# Patient Record
Sex: Female | Born: 1960
Health system: Southern US, Community
[De-identification: ages and names within clinical notes are randomized; demographics above are authoritative.]

## PROBLEM LIST (undated history)

## (undated) DIAGNOSIS — D649 Anemia, unspecified: Secondary | ICD-10-CM

## (undated) DIAGNOSIS — K219 Gastro-esophageal reflux disease without esophagitis: Secondary | ICD-10-CM

## (undated) DIAGNOSIS — E039 Hypothyroidism, unspecified: Secondary | ICD-10-CM

## (undated) DIAGNOSIS — I10 Essential (primary) hypertension: Secondary | ICD-10-CM

## (undated) DIAGNOSIS — C801 Malignant (primary) neoplasm, unspecified: Secondary | ICD-10-CM

## (undated) DIAGNOSIS — E079 Disorder of thyroid, unspecified: Secondary | ICD-10-CM

## (undated) HISTORY — PX: TUBAL LIGATION: SHX77

## (undated) HISTORY — PX: FUNCTIONAL ENDOSCOPIC SINUS SURGERY: SUR616

## (undated) HISTORY — PX: NOVASURE ABLATION: SHX5394

## (undated) HISTORY — DX: Disorder of thyroid, unspecified: E07.9

## (undated) HISTORY — PX: COLONOSCOPY: SHX174

---

## 2005-08-03 ENCOUNTER — Emergency Department: Payer: Self-pay | Admitting: Emergency Medicine

## 2006-07-17 ENCOUNTER — Ambulatory Visit: Payer: Self-pay | Admitting: Obstetrics and Gynecology

## 2007-07-20 ENCOUNTER — Ambulatory Visit: Payer: Self-pay | Admitting: Obstetrics and Gynecology

## 2007-10-19 ENCOUNTER — Ambulatory Visit: Payer: Self-pay | Admitting: Obstetrics and Gynecology

## 2007-10-19 ENCOUNTER — Other Ambulatory Visit: Payer: Self-pay

## 2007-10-22 ENCOUNTER — Ambulatory Visit: Payer: Self-pay | Admitting: Obstetrics and Gynecology

## 2008-07-21 ENCOUNTER — Ambulatory Visit: Payer: Self-pay | Admitting: Obstetrics and Gynecology

## 2008-12-19 ENCOUNTER — Ambulatory Visit: Payer: Self-pay | Admitting: Family Medicine

## 2009-07-25 ENCOUNTER — Ambulatory Visit: Payer: Self-pay

## 2010-09-11 ENCOUNTER — Ambulatory Visit: Payer: Self-pay

## 2011-09-19 ENCOUNTER — Ambulatory Visit: Payer: Self-pay

## 2012-09-22 ENCOUNTER — Ambulatory Visit: Payer: Self-pay

## 2013-10-04 ENCOUNTER — Ambulatory Visit: Payer: Self-pay

## 2014-09-08 DIAGNOSIS — E039 Hypothyroidism, unspecified: Secondary | ICD-10-CM | POA: Insufficient documentation

## 2017-01-19 DIAGNOSIS — M25561 Pain in right knee: Secondary | ICD-10-CM | POA: Diagnosis not present

## 2017-01-19 DIAGNOSIS — M2391 Unspecified internal derangement of right knee: Secondary | ICD-10-CM | POA: Diagnosis not present

## 2017-01-20 ENCOUNTER — Other Ambulatory Visit: Payer: Self-pay | Admitting: Physician Assistant

## 2017-01-20 DIAGNOSIS — M2391 Unspecified internal derangement of right knee: Secondary | ICD-10-CM

## 2017-01-26 ENCOUNTER — Ambulatory Visit (HOSPITAL_COMMUNITY)
Admission: RE | Admit: 2017-01-26 | Discharge: 2017-01-26 | Disposition: A | Discharge status: Home / Self Care | Payer: 59 | Source: Ambulatory Visit | Attending: Physician Assistant | Admitting: Physician Assistant

## 2017-01-26 DIAGNOSIS — M25561 Pain in right knee: Secondary | ICD-10-CM | POA: Diagnosis not present

## 2017-01-26 DIAGNOSIS — M2391 Unspecified internal derangement of right knee: Secondary | ICD-10-CM | POA: Diagnosis not present

## 2017-01-26 DIAGNOSIS — M7121 Synovial cyst of popliteal space [Baker], right knee: Secondary | ICD-10-CM | POA: Insufficient documentation

## 2017-02-06 DIAGNOSIS — I10 Essential (primary) hypertension: Secondary | ICD-10-CM | POA: Diagnosis not present

## 2017-02-06 DIAGNOSIS — Z124 Encounter for screening for malignant neoplasm of cervix: Secondary | ICD-10-CM | POA: Diagnosis not present

## 2017-02-06 DIAGNOSIS — E038 Other specified hypothyroidism: Secondary | ICD-10-CM | POA: Diagnosis not present

## 2017-02-12 DIAGNOSIS — M2391 Unspecified internal derangement of right knee: Secondary | ICD-10-CM | POA: Diagnosis not present

## 2017-02-24 ENCOUNTER — Encounter
Admission: RE | Admit: 2017-02-24 | Discharge: 2017-02-24 | Disposition: A | Discharge status: Home / Self Care | Payer: 59 | Source: Ambulatory Visit | Attending: Orthopedic Surgery | Admitting: Orthopedic Surgery

## 2017-02-24 DIAGNOSIS — Z01810 Encounter for preprocedural cardiovascular examination: Secondary | ICD-10-CM | POA: Insufficient documentation

## 2017-02-24 DIAGNOSIS — I10 Essential (primary) hypertension: Secondary | ICD-10-CM | POA: Insufficient documentation

## 2017-02-24 HISTORY — DX: Essential (primary) hypertension: I10

## 2017-02-24 HISTORY — DX: Anemia, unspecified: D64.9

## 2017-02-24 HISTORY — DX: Gastro-esophageal reflux disease without esophagitis: K21.9

## 2017-02-24 HISTORY — DX: Malignant (primary) neoplasm, unspecified: C80.1

## 2017-02-24 HISTORY — DX: Hypothyroidism, unspecified: E03.9

## 2017-02-24 NOTE — Patient Instructions (Signed)
  Your procedure is scheduled on: 03-04-17 (Wednesday) Report to Same Day Surgery 2nd floor medical mall [facility] Entrance-take elevator on left to 2nd floor.  Check in with surgery information desk.) To find out your arrival time please call [phone] between 1PM - 3PM on 03-03-17 (Tuesday)  Remember: Instructions that are not followed completely may result in serious medical risk, up to and including death, or upon the discretion of your surgeon and anesthesiologist your surgery may need to be rescheduled.    _x___ 1. Do not eat food or drink liquids after midnight. No gum chewing or hard candies.     __x__ 2. No Alcohol for 24 hours before or after surgery.   __x__3. No Smoking for 24 prior to surgery.   ____  4. Bring all medications with you on the day of surgery if instructed.    __x__ 5. Notify your doctor if there is any change in your medical condition     (cold, fever, infections).     Do not wear jewelry, make-up, hairpins, clips or nail polish.  Do not wear lotions, powders, or perfumes. You may wear deodorant.  Do not shave 48 hours prior to surgery. Men may shave face and neck.  Do not bring valuables to the hospital.    [facility] is not responsible for any belongings or valuables.               Contacts, dentures or bridgework may not be worn into surgery.  Leave your suitcase in the car. After surgery it may be brought to your room.  For patients admitted to the hospital, discharge time is determined by your treatment team.   Patients discharged the day of surgery will not be allowed to drive home.  You will need someone to drive you home and stay with you the night of your procedure.    Please read over the following fact sheets that you were given:      _x___ Take anti-hypertensive (unless it includes a diuretic), cardiac, seizure, asthma,     anti-reflux and psychiatric medicines. These include:  1. WELLBUTRIN (BUPROPION)  2. SERTRALINE  (ZOLOFT)  3.  4.  5.  6.  ____Fleets enema or Magnesium Citrate as directed.   _x___ Use CHG Soap or sage wipes as directed on instruction sheet   ____ Use inhalers on the day of surgery and bring to hospital day of surgery  ____ Stop Metformin and Janumet 2 days prior to surgery.    ____ Take 1/2 of usual insulin dose the night before surgery and none on the morning     surgery.   ____ Follow recommendations from Cardiologist, Pulmonologist or PCP regarding stopping Aspirin, Coumadin, Pllavix ,Eliquis, Effient, or Pradaxa, and Pletal-STOP ASPIRIN NOW  X____Stop Anti-inflammatories such as Advil, Aleve, Ibuprofen, Motrin, Naproxen, Naprosyn, Goodies powders or aspirin products NOW-OK to take Tylenol    _x___ Stop supplements until after surgery-STOP BIOTIN AND FISH OIL NOW   ____ Bring C-Pap to the hospital.

## 2017-02-26 ENCOUNTER — Encounter
Admission: RE | Admit: 2017-02-26 | Discharge: 2017-02-26 | Disposition: A | Discharge status: Home / Self Care | Payer: 59 | Source: Ambulatory Visit | Attending: Orthopedic Surgery | Admitting: Orthopedic Surgery

## 2017-02-26 DIAGNOSIS — I10 Essential (primary) hypertension: Secondary | ICD-10-CM | POA: Diagnosis not present

## 2017-02-26 DIAGNOSIS — Z01810 Encounter for preprocedural cardiovascular examination: Secondary | ICD-10-CM | POA: Diagnosis not present

## 2017-03-04 ENCOUNTER — Encounter: Payer: Self-pay | Admitting: Orthopedic Surgery

## 2017-03-04 ENCOUNTER — Encounter
Admission: RE | Disposition: A | Discharge status: Home / Self Care | Payer: Self-pay | Source: Ambulatory Visit | Attending: Orthopedic Surgery

## 2017-03-04 ENCOUNTER — Ambulatory Visit
Admission: RE | Admit: 2017-03-04 | Discharge: 2017-03-04 | Disposition: A | Discharge status: Home / Self Care | Payer: 59 | Source: Ambulatory Visit | Attending: Orthopedic Surgery | Admitting: Orthopedic Surgery

## 2017-03-04 ENCOUNTER — Ambulatory Visit: Payer: 59 | Admitting: Certified Registered Nurse Anesthetist

## 2017-03-04 DIAGNOSIS — M2391 Unspecified internal derangement of right knee: Secondary | ICD-10-CM | POA: Diagnosis present

## 2017-03-04 DIAGNOSIS — M94261 Chondromalacia, right knee: Secondary | ICD-10-CM | POA: Diagnosis not present

## 2017-03-04 DIAGNOSIS — Z79899 Other long term (current) drug therapy: Secondary | ICD-10-CM | POA: Insufficient documentation

## 2017-03-04 DIAGNOSIS — F329 Major depressive disorder, single episode, unspecified: Secondary | ICD-10-CM | POA: Diagnosis not present

## 2017-03-04 DIAGNOSIS — Z7982 Long term (current) use of aspirin: Secondary | ICD-10-CM | POA: Insufficient documentation

## 2017-03-04 DIAGNOSIS — E039 Hypothyroidism, unspecified: Secondary | ICD-10-CM | POA: Insufficient documentation

## 2017-03-04 DIAGNOSIS — I10 Essential (primary) hypertension: Secondary | ICD-10-CM | POA: Insufficient documentation

## 2017-03-04 DIAGNOSIS — Z87891 Personal history of nicotine dependence: Secondary | ICD-10-CM | POA: Diagnosis not present

## 2017-03-04 DIAGNOSIS — M2341 Loose body in knee, right knee: Secondary | ICD-10-CM | POA: Insufficient documentation

## 2017-03-04 DIAGNOSIS — F32A Depression, unspecified: Secondary | ICD-10-CM | POA: Insufficient documentation

## 2017-03-04 HISTORY — PX: KNEE ARTHROSCOPY: SHX127

## 2017-03-04 SURGERY — ARTHROSCOPY, KNEE
Anesthesia: General | Site: Knee | Laterality: Right | Wound class: Clean

## 2017-03-04 MED ORDER — CHLORHEXIDINE GLUCONATE 4 % EX LIQD: 60.0000 mL | Freq: Once | CUTANEOUS | Status: DC

## 2017-03-04 MED ORDER — DEXAMETHASONE SODIUM PHOSPHATE 10 MG/ML IJ SOLN
INTRAMUSCULAR | Status: AC
  Filled 2017-03-04: qty 1

## 2017-03-04 MED ORDER — ONDANSETRON HCL 4 MG/2ML IJ SOLN: 4.0000 mg | Freq: Once | INTRAMUSCULAR | Status: DC | PRN

## 2017-03-04 MED ORDER — ONDANSETRON HCL 4 MG/2ML IJ SOLN
INTRAMUSCULAR | Status: AC
  Filled 2017-03-04: qty 2

## 2017-03-04 MED ORDER — PROPOFOL 10 MG/ML IV BOLUS
INTRAVENOUS | Status: AC
  Filled 2017-03-04: qty 20

## 2017-03-04 MED ORDER — BUPIVACAINE-EPINEPHRINE 0.25% -1:200000 IJ SOLN
INTRAMUSCULAR | Status: DC | PRN
  Administered 2017-03-04: 5 mL
  Administered 2017-03-04: 25 mL

## 2017-03-04 MED ORDER — FENTANYL CITRATE (PF) 100 MCG/2ML IJ SOLN
INTRAMUSCULAR | Status: AC
  Filled 2017-03-04: qty 2

## 2017-03-04 MED ORDER — BUPIVACAINE-EPINEPHRINE (PF) 0.25% -1:200000 IJ SOLN
INTRAMUSCULAR | Status: AC
  Filled 2017-03-04: qty 30

## 2017-03-04 MED ORDER — MIDAZOLAM HCL 2 MG/2ML IJ SOLN
INTRAMUSCULAR | Status: AC
  Filled 2017-03-04: qty 2

## 2017-03-04 MED ORDER — PROPOFOL 10 MG/ML IV BOLUS
INTRAVENOUS | Status: DC | PRN
  Administered 2017-03-04: 200 mg via INTRAVENOUS

## 2017-03-04 MED ORDER — LIDOCAINE HCL (CARDIAC) 20 MG/ML IV SOLN
INTRAVENOUS | Status: DC | PRN
  Administered 2017-03-04: 50 mg via INTRAVENOUS

## 2017-03-04 MED ORDER — MIDAZOLAM HCL 2 MG/2ML IJ SOLN
INTRAMUSCULAR | Status: DC | PRN
  Administered 2017-03-04: 2 mg via INTRAVENOUS

## 2017-03-04 MED ORDER — FAMOTIDINE 20 MG PO TABS
20.0000 mg | ORAL_TABLET | Freq: Once | ORAL | Status: AC
  Administered 2017-03-04: 20 mg via ORAL

## 2017-03-04 MED ORDER — FAMOTIDINE 20 MG PO TABS
ORAL_TABLET | ORAL | Status: AC
  Administered 2017-03-04: 20 mg via ORAL
  Filled 2017-03-04: qty 1

## 2017-03-04 MED ORDER — ONDANSETRON HCL 4 MG/2ML IJ SOLN
INTRAMUSCULAR | Status: DC | PRN
  Administered 2017-03-04: 4 mg via INTRAVENOUS

## 2017-03-04 MED ORDER — GLYCOPYRROLATE 0.2 MG/ML IJ SOLN
INTRAMUSCULAR | Status: AC
  Filled 2017-03-04: qty 1

## 2017-03-04 MED ORDER — FENTANYL CITRATE (PF) 100 MCG/2ML IJ SOLN: 25.0000 ug | INTRAMUSCULAR | Status: DC | PRN

## 2017-03-04 MED ORDER — MORPHINE SULFATE (PF) 4 MG/ML IV SOLN
INTRAVENOUS | Status: DC | PRN
  Administered 2017-03-04: 4 mg

## 2017-03-04 MED ORDER — HYDROCODONE-ACETAMINOPHEN 5-325 MG PO TABS: 1.0000 | ORAL_TABLET | ORAL | 0 refills | Status: DC | PRN

## 2017-03-04 MED ORDER — MORPHINE SULFATE (PF) 4 MG/ML IV SOLN
INTRAVENOUS | Status: AC
  Filled 2017-03-04: qty 1

## 2017-03-04 MED ORDER — ACETAMINOPHEN 10 MG/ML IV SOLN
INTRAVENOUS | Status: AC
  Filled 2017-03-04: qty 100

## 2017-03-04 MED ORDER — DEXAMETHASONE SODIUM PHOSPHATE 10 MG/ML IJ SOLN
INTRAMUSCULAR | Status: DC | PRN
  Administered 2017-03-04: 10 mg via INTRAVENOUS

## 2017-03-04 MED ORDER — GLYCOPYRROLATE 0.2 MG/ML IJ SOLN
INTRAMUSCULAR | Status: DC | PRN
  Administered 2017-03-04: 0.2 mg via INTRAVENOUS

## 2017-03-04 MED ORDER — FENTANYL CITRATE (PF) 100 MCG/2ML IJ SOLN
INTRAMUSCULAR | Status: DC | PRN
  Administered 2017-03-04: 25 ug via INTRAVENOUS
  Administered 2017-03-04: 50 ug via INTRAVENOUS
  Administered 2017-03-04: 25 ug via INTRAVENOUS

## 2017-03-04 MED ORDER — PHENYLEPHRINE HCL 10 MG/ML IJ SOLN
INTRAMUSCULAR | Status: DC | PRN
  Administered 2017-03-04 (×6): 100 ug via INTRAVENOUS

## 2017-03-04 MED ORDER — LIDOCAINE HCL (PF) 2 % IJ SOLN
INTRAMUSCULAR | Status: AC
  Filled 2017-03-04: qty 2

## 2017-03-04 MED ORDER — ACETAMINOPHEN 10 MG/ML IV SOLN
INTRAVENOUS | Status: DC | PRN
  Administered 2017-03-04: 1000 mg via INTRAVENOUS

## 2017-03-04 MED ORDER — LACTATED RINGERS IV SOLN
INTRAVENOUS | Status: DC
  Administered 2017-03-04 (×2): via INTRAVENOUS

## 2017-03-04 MED ORDER — EPHEDRINE SULFATE 50 MG/ML IJ SOLN
INTRAMUSCULAR | Status: DC | PRN
  Administered 2017-03-04 (×4): 5 mg via INTRAVENOUS
  Administered 2017-03-04: 10 mg via INTRAVENOUS
  Administered 2017-03-04 (×2): 5 mg via INTRAVENOUS

## 2017-03-04 SURGICAL SUPPLY — 23 items
BLADE SHAVER 4.5 DBL SERAT CV (CUTTER) ×3 IMPLANT
BNDG ESMARK 6X12 TAN STRL LF (GAUZE/BANDAGES/DRESSINGS) ×3 IMPLANT
CUFF TOURN 24 STER (MISCELLANEOUS) ×3 IMPLANT
CUFF TOURN 30 STER DUAL PORT (MISCELLANEOUS) IMPLANT
DRSG DERMACEA 8X12 NADH (GAUZE/BANDAGES/DRESSINGS) ×3 IMPLANT
DURAPREP 26ML APPLICATOR (WOUND CARE) ×6 IMPLANT
GAUZE SPONGE 4X4 12PLY STRL (GAUZE/BANDAGES/DRESSINGS) ×3 IMPLANT
GLOVE BIOGEL M STRL SZ7.5 (GLOVE) ×3 IMPLANT
GLOVE INDICATOR 8.0 STRL GRN (GLOVE) ×3 IMPLANT
GOWN STRL REUS W/ TWL LRG LVL3 (GOWN DISPOSABLE) ×2 IMPLANT
GOWN STRL REUS W/TWL LRG LVL3 (GOWN DISPOSABLE) ×4
IV LACTATED RINGER IRRG 3000ML (IV SOLUTION) ×12
IV LR IRRIG 3000ML ARTHROMATIC (IV SOLUTION) ×6 IMPLANT
KIT RM TURNOVER STRD PROC AR (KITS) ×3 IMPLANT
MANIFOLD NEPTUNE II (INSTRUMENTS) ×3 IMPLANT
PACK ARTHROSCOPY KNEE (MISCELLANEOUS) ×3 IMPLANT
SET TUBE SUCT SHAVER OUTFL 24K (TUBING) ×3 IMPLANT
SET TUBE TIP INTRA-ARTICULAR (MISCELLANEOUS) ×3 IMPLANT
SUT ETHILON 3-0 FS-10 30 BLK (SUTURE) ×3
SUTURE EHLN 3-0 FS-10 30 BLK (SUTURE) ×1 IMPLANT
TUBING ARTHRO INFLOW-ONLY STRL (TUBING) ×3 IMPLANT
WAND HAND CNTRL MULTIVAC 50 (MISCELLANEOUS) ×3 IMPLANT
WRAP KNEE W/COLD PACKS 25.5X14 (SOFTGOODS) ×3 IMPLANT

## 2017-03-04 NOTE — Anesthesia Preprocedure Evaluation (Signed)
Anesthesia Evaluation  Patient identified by MRN, date of birth, ID band Patient awake    Reviewed: Allergy & Precautions, NPO status , Patient's Chart, lab work & pertinent test results, reviewed documented beta blocker date and time   Airway Mallampati: III  TM Distance: >3 FB     Dental  (+) Chipped   Pulmonary asthma , former smoker,           Cardiovascular hypertension, Pt. on medications and Pt. on home beta blockers      Neuro/Psych PSYCHIATRIC DISORDERS Depression    GI/Hepatic GERD  Controlled,  Endo/Other  Hypothyroidism   Renal/GU      Musculoskeletal   Abdominal   Peds  Hematology  (+) anemia ,   Anesthesia Other Findings   Reproductive/Obstetrics                             Anesthesia Physical Anesthesia Plan  ASA: II  Anesthesia Plan: General   Post-op Pain Management:    Induction: Intravenous  Airway Management Planned: LMA  Additional Equipment:   Intra-op Plan:   Post-operative Plan:   Informed Consent: I have reviewed the patients History and Physical, chart, labs and discussed the procedure including the risks, benefits and alternatives for the proposed anesthesia with the patient or authorized representative who has indicated his/her understanding and acceptance.     Plan Discussed with: CRNA  Anesthesia Plan Comments:         Anesthesia Quick Evaluation

## 2017-03-04 NOTE — Anesthesia Post-op Follow-up Note (Cosign Needed)
Anesthesia QCDR form completed.        

## 2017-03-04 NOTE — Discharge Instructions (Signed)
AMBULATORY SURGERY  DISCHARGE INSTRUCTIONS   1) The drugs that you were given will stay in your system until tomorrow so for the next 24 hours you should not:  A) Drive an automobile B) Make any legal decisions C) Drink any alcoholic beverage   2) You may resume regular meals tomorrow.  Today it is better to start with liquids and gradually work up to solid foods.  You may eat anything you prefer, but it is better to start with liquids, then soup and crackers, and gradually work up to solid foods.   3) Please notify your doctor immediately if you have any unusual bleeding, trouble breathing, redness and pain at the surgery site, drainage, fever, or pain not relieved by medication. 4)   5) Your post-operative visit with Dr.                                     is: Date:                        Time:    Please call to schedule your post-operative visit.  6) Additional Instructions:       Instructions after Knee Arthroscopy    James P. Holley Bouche., M.D.     Dept. of [facility]  [address] [place], [place]  [address]   Phone: [phone]   Fax: [phone]   DIET:  Drink plenty of non-alcoholic fluids & begin a light diet.  Resume your normal diet the day after surgery.  ACTIVITY:   You may use crutches or a walker with weight-bearing as tolerated, unless instructed otherwise.  You may wean yourself off of the walker or crutches as tolerated.   Begin doing gentle exercises. Exercising will reduce the pain and swelling, increase motion, and prevent muscle weakness.    Avoid strenuous activities or athletics for a minimum of 4-6 weeks after arthroscopic surgery.  Do not drive or operate any equipment until instructed.  WOUND CARE:   Place one to two pillows under the knee the first day or two when sitting or lying.   Continue to use the ice packs periodically to reduce pain and swelling.  The small incisions in  your knee are closed with nylon stitches. The stitches will be removed in the office.  The bulky dressing may be removed on the second day after surgery. DO NOT TOUCH THE STITCHES. Put a Band-Aid over each stitch. Do NOT use any ointments or creams on the incisions.   You may bathe or shower after the stitches are removed at the first office visit following surgery.  MEDICATIONS:  You may resume your regular medications.  Please take the pain medication as prescribed.  Do not take pain medication on an empty stomach.  Do not drive or drink alcoholic beverages when taking pain medications.  CALL THE OFFICE FOR:  Temperature above 101 degrees  Excessive bleeding or drainage on the dressing.  Excessive swelling, coldness, or paleness of the toes.  Persistent nausea and vomiting.  FOLLOW-UP:   You should have an appointment to return to the office in 7-10 days after surgery.   AMBULATORY SURGERY  DISCHARGE INSTRUCTIONS   7) The drugs that you were given will stay in your system until tomorrow so for the next 24 hours you should not:  D) Drive an automobile E) Make  any legal decisions F) Drink any alcoholic beverage   8) You may resume regular meals tomorrow.  Today it is better to start with liquids and gradually work up to solid foods.  You may eat anything you prefer, but it is better to start with liquids, then soup and crackers, and gradually work up to solid foods.   9) Please notify your doctor immediately if you have any unusual bleeding, trouble breathing, redness and pain at the surgery site, drainage, fever, or pain not relieved by medication.    10) Additional Instructions:        Please contact your physician with any problems or Same Day Surgery at [phone], Monday through Friday 6 am to 4 pm, or [place] at [facility] number at [phone].

## 2017-03-04 NOTE — Transfer of Care (Signed)
Immediate Anesthesia Transfer of Care Note  Patient: Grace Christensen  Procedure(s) Performed: Procedure(s): ARTHROSCOPY KNEE, REMOVAL OF LOOSE BODY. CHONDROPLASTY MEDIAL COMPARTMENT (Right)  Patient Location: PACU  Anesthesia Type:General  Level of Consciousness: sedated  Airway & Oxygen Therapy: Patient Spontanous Breathing and Patient connected to face mask oxygen  Post-op Assessment: Report given to RN and Post -op Vital signs reviewed and stable  Post vital signs: Reviewed and stable  Last Vitals:  Vitals:   03/04/17 1431  BP: 132/78  Pulse: 69  Resp: 20  Temp: 36.8 C    Last Pain:  Vitals:   03/04/17 1431  TempSrc: Oral  PainSc: 5       Patients Stated Pain Goal: 2 (40/34/74 2595)  Complications: No apparent anesthesia complications

## 2017-03-04 NOTE — Op Note (Signed)
OPERATIVE NOTE  DATE OF SURGERY:  03/04/2017  PATIENT NAME:  Grace Christensen   DOB: Jan 24, 1961  MRN: [id number]   PRE-OPERATIVE DIAGNOSIS:  Internal derangement of the right knee   POST-OPERATIVE DIAGNOSIS:   Osteochondral loose body of the right knee Grade 3 chondromalacia of the medial femoral condyle, right knee  PROCEDURE:  Right knee arthroscopy, removal of osteochondral loose body, and chondroplasty  SURGEON:  Marciano Sequin., M.D.   ASSISTANT: none  ANESTHESIA: general  ESTIMATED BLOOD LOSS: Minimal  FLUIDS REPLACED: 1000 mL of crystalloid  TOURNIQUET TIME: Not used   DRAINS: none  IMPLANTS UTILIZED: None  INDICATIONS FOR SURGERY: JOELLYN GRANDT is a 56 y.o. year old female who has been seen for complaints of right knee pain. MRI demonstrated findings consistent with meniscal pathology. After discussion of the risks and benefits of surgical intervention, the patient expressed understanding of the risks benefits and agree with plans for right knee arthroscopy.   PROCEDURE IN DETAIL: The patient was brought into the operating room and, after adequate general anesthesia was achieved, a tourniquet was applied to the right thigh and the leg was placed in the leg holder. All bony prominences were well padded. The patient's right knee was cleaned and prepped with alcohol and Duraprep and draped in the usual sterile fashion. A "timeout" was performed as per usual protocol. The anticipated portal sites were injected with 0.25% Marcaine with epinephrine. An anterolateral incision was made and a cannula was inserted. A small effusion was evacuated and the knee was distended with fluid using the pump. The scope was advanced down the medial gutter into the medial compartment. Under visualization with the scope, an anteromedial portal was created and a hooked probe was inserted. The medial meniscus was visualized and probed. The medial meniscus was intact without evidence of tear. There  was an osteochondral loose body that was found in the medial compartment. This was removed using pituitary forceps. The articular cartilage was visualized. There were grade 2-3 changes of chondromalacia involving primarily the posterior aspect of the medial femoral condyle with lesser changes noted to the medial tibial plateau. These areas were debrided and contoured using the ArthroCare wand.  The scope was then advanced into the intercondylar notch. The anterior cruciate ligament was visualized and probed and felt to be intact. The scope was removed from the lateral portal and reinserted via the anteromedial portal to better visualize the lateral compartment. The lateral meniscus was visualized and probed. The lateral meniscus was intact without evidence of tear or instability. The articular cartilage of the lateral compartment was visualized and noted to be in excellent condition. Finally, the scope was advanced so as to visualize the patellofemoral articulation. Good patellar tracking was appreciated.   The knee was irrigated with copius amounts of fluid and suctioned dry. The anterolateral portal was re-approximated with #3-0 nylon. A combination of 0.25% Marcaine with epinephrine and 4 mg of Morphine were injected via the scope. The scope was removed and the anteromedial portal was re-approximated with #3-0 nylon. A sterile dressing was applied followed by application of an ice wrap.  The patient tolerated the procedure well and was transported to the PACU in stable condition.  James P. Holley Bouche., M.D.

## 2017-03-04 NOTE — H&P (Signed)
The patient has been re-examined, and the chart reviewed, and there have been no interval changes to the documented history and physical.    The risks, benefits, and alternatives have been discussed at length. The patient expressed understanding of the risks benefits and agreed with plans for surgical intervention.  James P. Hooten, Jr. M.D.    

## 2017-03-04 NOTE — Anesthesia Procedure Notes (Signed)
Procedure Name: LMA Insertion Date/Time: 03/04/2017 4:43 PM Performed by: Johnna Acosta Pre-anesthesia Checklist: Patient identified, Emergency Drugs available, Suction available, Patient being monitored and Timeout performed Patient Re-evaluated:Patient Re-evaluated prior to inductionOxygen Delivery Method: Circle system utilized Preoxygenation: Pre-oxygenation with 100% oxygen Intubation Type: IV induction LMA: LMA inserted LMA Size: 4.0 Tube type: Oral Number of attempts: 1 Placement Confirmation: positive ETCO2 and breath sounds checked- equal and bilateral Tube secured with: Tape Dental Injury: Teeth and Oropharynx as per pre-operative assessment

## 2017-03-05 ENCOUNTER — Encounter: Payer: Self-pay | Admitting: Orthopedic Surgery

## 2017-03-11 NOTE — Anesthesia Postprocedure Evaluation (Signed)
Anesthesia Post Note  Patient: CARMISHA LARUSSO  Procedure(s) Performed: Procedure(s) (LRB): ARTHROSCOPY KNEE, REMOVAL OF LOOSE BODY. CHONDROPLASTY MEDIAL COMPARTMENT (Right)  Patient location during evaluation: PACU Anesthesia Type: General Level of consciousness: awake and alert Pain management: pain level controlled Vital Signs Assessment: post-procedure vital signs reviewed and stable Respiratory status: spontaneous breathing, nonlabored ventilation, respiratory function stable and patient connected to nasal cannula oxygen Cardiovascular status: blood pressure returned to baseline and stable Postop Assessment: no signs of nausea or vomiting Anesthetic complications: no     Last Vitals:  Vitals:   03/04/17 1849 03/04/17 1910  BP: (!) 153/82 (!) 156/87  Pulse: 80 74  Resp: 18 16  Temp:      Last Pain:  Vitals:   03/04/17 1849  TempSrc: Temporal  PainSc: 0-No pain                 Martha Clan

## 2017-06-08 DIAGNOSIS — I10 Essential (primary) hypertension: Secondary | ICD-10-CM | POA: Diagnosis not present

## 2017-06-08 DIAGNOSIS — E038 Other specified hypothyroidism: Secondary | ICD-10-CM | POA: Diagnosis not present

## 2017-08-25 DIAGNOSIS — Z23 Encounter for immunization: Secondary | ICD-10-CM | POA: Diagnosis not present

## 2017-08-25 DIAGNOSIS — E038 Other specified hypothyroidism: Secondary | ICD-10-CM | POA: Diagnosis not present

## 2017-08-25 DIAGNOSIS — I10 Essential (primary) hypertension: Secondary | ICD-10-CM | POA: Diagnosis not present

## 2017-10-19 DIAGNOSIS — J019 Acute sinusitis, unspecified: Secondary | ICD-10-CM | POA: Diagnosis not present

## 2017-10-19 DIAGNOSIS — I10 Essential (primary) hypertension: Secondary | ICD-10-CM | POA: Diagnosis not present

## 2017-10-19 DIAGNOSIS — Z0001 Encounter for general adult medical examination with abnormal findings: Secondary | ICD-10-CM | POA: Diagnosis not present

## 2017-10-19 DIAGNOSIS — Z124 Encounter for screening for malignant neoplasm of cervix: Secondary | ICD-10-CM | POA: Diagnosis not present

## 2017-11-04 DIAGNOSIS — S76319A Strain of muscle, fascia and tendon of the posterior muscle group at thigh level, unspecified thigh, initial encounter: Secondary | ICD-10-CM | POA: Insufficient documentation

## 2017-11-04 DIAGNOSIS — S76312A Strain of muscle, fascia and tendon of the posterior muscle group at thigh level, left thigh, initial encounter: Secondary | ICD-10-CM | POA: Diagnosis not present

## 2017-11-16 DIAGNOSIS — M25561 Pain in right knee: Secondary | ICD-10-CM | POA: Diagnosis not present

## 2017-11-16 DIAGNOSIS — M25562 Pain in left knee: Secondary | ICD-10-CM | POA: Diagnosis not present

## 2017-11-16 DIAGNOSIS — M2242 Chondromalacia patellae, left knee: Secondary | ICD-10-CM | POA: Diagnosis not present

## 2017-11-18 ENCOUNTER — Other Ambulatory Visit: Payer: Self-pay

## 2017-11-18 DIAGNOSIS — M2241 Chondromalacia patellae, right knee: Secondary | ICD-10-CM | POA: Diagnosis not present

## 2017-11-18 DIAGNOSIS — M25562 Pain in left knee: Secondary | ICD-10-CM | POA: Diagnosis not present

## 2017-11-18 DIAGNOSIS — M25561 Pain in right knee: Secondary | ICD-10-CM | POA: Diagnosis not present

## 2017-11-18 MED ORDER — AMOXICILLIN 875 MG PO TABS: 875.0000 mg | ORAL_TABLET | Freq: Two times a day (BID) | ORAL | 0 refills | Status: AC

## 2017-11-26 ENCOUNTER — Other Ambulatory Visit: Payer: Self-pay | Admitting: Nurse Practitioner

## 2017-11-26 DIAGNOSIS — E559 Vitamin D deficiency, unspecified: Secondary | ICD-10-CM | POA: Diagnosis not present

## 2017-11-26 DIAGNOSIS — M25561 Pain in right knee: Secondary | ICD-10-CM | POA: Diagnosis not present

## 2017-11-26 DIAGNOSIS — E039 Hypothyroidism, unspecified: Secondary | ICD-10-CM | POA: Diagnosis not present

## 2017-11-26 DIAGNOSIS — Z0001 Encounter for general adult medical examination with abnormal findings: Secondary | ICD-10-CM | POA: Diagnosis not present

## 2017-11-26 DIAGNOSIS — M2241 Chondromalacia patellae, right knee: Secondary | ICD-10-CM | POA: Diagnosis not present

## 2017-11-26 DIAGNOSIS — M25562 Pain in left knee: Secondary | ICD-10-CM | POA: Diagnosis not present

## 2017-12-03 DIAGNOSIS — M25561 Pain in right knee: Secondary | ICD-10-CM | POA: Diagnosis not present

## 2017-12-03 DIAGNOSIS — M25562 Pain in left knee: Secondary | ICD-10-CM | POA: Diagnosis not present

## 2017-12-03 DIAGNOSIS — M2241 Chondromalacia patellae, right knee: Secondary | ICD-10-CM | POA: Diagnosis not present

## 2017-12-08 ENCOUNTER — Other Ambulatory Visit: Payer: Self-pay | Admitting: Internal Medicine

## 2017-12-11 DIAGNOSIS — M2241 Chondromalacia patellae, right knee: Secondary | ICD-10-CM | POA: Diagnosis not present

## 2017-12-11 DIAGNOSIS — M25562 Pain in left knee: Secondary | ICD-10-CM | POA: Diagnosis not present

## 2017-12-11 DIAGNOSIS — M25561 Pain in right knee: Secondary | ICD-10-CM | POA: Diagnosis not present

## 2017-12-18 LAB — COMPREHENSIVE METABOLIC PANEL
A/G RATIO: 1.6 (ref 1.2–2.2)
ALT: 15 IU/L (ref 0–32)
AST: 10 IU/L (ref 0–40)
Albumin: 4.1 g/dL (ref 3.5–5.5)
Alkaline Phosphatase: 101 IU/L (ref 39–117)
BUN / CREAT RATIO: 23 (ref 9–23)
BUN: 16 mg/dL (ref 6–24)
Bilirubin Total: 0.2 mg/dL (ref 0.0–1.2)
CALCIUM: 9.5 mg/dL (ref 8.7–10.2)
CO2: 23 mmol/L (ref 20–29)
Chloride: 98 mmol/L (ref 96–106)
Creatinine, Ser: 0.7 mg/dL (ref 0.57–1.00)
GFR, EST AFRICAN AMERICAN: 111 mL/min/{1.73_m2} (ref >59)
GFR, EST NON AFRICAN AMERICAN: 96 mL/min/{1.73_m2} (ref >59)
Globulin, Total: 2.6 g/dL (ref 1.5–4.5)
Glucose: 96 mg/dL (ref 65–99)
POTASSIUM: 4 mmol/L (ref 3.5–5.2)
Sodium: 140 mmol/L (ref 134–144)
TOTAL PROTEIN: 6.7 g/dL (ref 6.0–8.5)

## 2017-12-18 LAB — CBC
HEMATOCRIT: 42.2 % (ref 34.0–46.6)
HEMOGLOBIN: 14 g/dL (ref 11.1–15.9)
MCH: 30 pg (ref 26.6–33.0)
MCHC: 33.2 g/dL (ref 31.5–35.7)
MCV: 91 fL (ref 79–97)
Platelets: 204 10*3/uL (ref 150–379)
RBC: 4.66 x10E6/uL (ref 3.77–5.28)
RDW: 14.1 % (ref 12.3–15.4)
WBC: 9 10*3/uL (ref 3.4–10.8)

## 2017-12-18 LAB — TSH: TSH: 1.8 u[IU]/mL (ref 0.450–4.500)

## 2017-12-18 LAB — VITAMIN D 25 HYDROXY (VIT D DEFICIENCY, FRACTURES): Vit D, 25-Hydroxy: 44.3 ng/mL (ref 30.0–100.0)

## 2017-12-18 LAB — T4, FREE: Free T4: 1.23 ng/dL (ref 0.82–1.77)

## 2017-12-23 DIAGNOSIS — M25562 Pain in left knee: Secondary | ICD-10-CM | POA: Diagnosis not present

## 2017-12-23 DIAGNOSIS — M2241 Chondromalacia patellae, right knee: Secondary | ICD-10-CM | POA: Diagnosis not present

## 2017-12-23 DIAGNOSIS — M25561 Pain in right knee: Secondary | ICD-10-CM | POA: Diagnosis not present

## 2018-01-05 DIAGNOSIS — M1712 Unilateral primary osteoarthritis, left knee: Secondary | ICD-10-CM | POA: Diagnosis not present

## 2018-01-12 DIAGNOSIS — Z1231 Encounter for screening mammogram for malignant neoplasm of breast: Secondary | ICD-10-CM | POA: Diagnosis not present

## 2018-02-16 ENCOUNTER — Ambulatory Visit: Payer: 59 | Admitting: Nurse Practitioner

## 2018-02-16 ENCOUNTER — Encounter: Payer: Self-pay | Admitting: Nurse Practitioner

## 2018-02-16 VITALS — BP 127/85 | HR 63 | Resp 16 | Ht 65.0 in | Wt 188.0 lb

## 2018-02-16 DIAGNOSIS — F411 Generalized anxiety disorder: Secondary | ICD-10-CM | POA: Diagnosis not present

## 2018-02-16 DIAGNOSIS — E039 Hypothyroidism, unspecified: Secondary | ICD-10-CM

## 2018-02-16 DIAGNOSIS — M170 Bilateral primary osteoarthritis of knee: Secondary | ICD-10-CM | POA: Diagnosis not present

## 2018-02-16 DIAGNOSIS — F3341 Major depressive disorder, recurrent, in partial remission: Secondary | ICD-10-CM | POA: Diagnosis not present

## 2018-02-16 MED ORDER — ALPRAZOLAM 0.25 MG PO TABS: 0.2500 mg | ORAL_TABLET | Freq: Two times a day (BID) | ORAL | 1 refills | Status: DC | PRN

## 2018-02-16 MED ORDER — MELOXICAM 7.5 MG PO TABS: 7.5000 mg | ORAL_TABLET | Freq: Every day | ORAL | 1 refills | Status: DC

## 2018-02-16 NOTE — Progress Notes (Signed)
[facility] [place], [place] [address]  Internal MEDICINE  Office Visit Note  Patient Name: Grace Christensen  [id number]  [id number]  Date of Service: 02/18/2018  Chief Complaint  Patient presents with  . Osteoarthritis    both knees. worse on right side.     The patient is here for routine follow up. Today, Grace Christensen is complaining of bilateral knees. Did have arthroscopic surgery on right knee 03/2016. Did not have rehab. As this was healing, Grace Christensen pulled hanstring of left leg. Did some physical therapy on this side. Did get some better. However, had family member in the hospital, which caused some stiffness and worsening pain. Did take NSAIDs, has been icing the knees. Has noted slight improvement.     Pt is here for routine follow up.    Current Medication: Outpatient Encounter Medications as of 02/16/2018  Medication Sig  . aspirin EC 81 MG tablet Take 81 mg by mouth daily.  . B Complex Vitamins (B COMPLEX PO) Place 1 mL under the tongue daily.  . Biotin 1000 MCG tablet Take 1,000 mcg by mouth daily.  . bisoprolol (ZEBETA) 10 MG tablet Take 10 mg by mouth at bedtime.   Marland Kitchen buPROPion (WELLBUTRIN XL) 300 MG 24 hr tablet Take 300 mg by mouth every morning.   . Calcium Carb-Cholecalciferol (CALCIUM 600+D3) 600-800 MG-UNIT TABS Take 1 tablet by mouth daily.  . Cholecalciferol (VITAMIN D) 2000 units tablet Take 2,000 Units by mouth daily.  . fexofenadine (ALLEGRA) 180 MG tablet Take 180 mg by mouth every morning. Allergy Relief   . levothyroxine (SYNTHROID, LEVOTHROID) 50 MCG tablet Take 50 mcg by mouth at bedtime.  . Omega-3 Fatty Acids (OMEGA-3 FISH OIL) 1200 MG CAPS Take 1-2 capsules by mouth See admin instructions. 1 capsule in the morning, and 2 capsules in the evening  . Potassium 99 MG TABS Take 1 tablet by mouth daily.  . sertraline (ZOLOFT) 100 MG tablet Take 200 mg by mouth every morning.   . traZODone (DESYREL) 100 MG tablet TAKE 2 TO 3 TABLETS BY  MOUTH  EVERY NIGHT AT  BEDTIME AS NEEDED FOR  INSOMNIA  . vitamin B-12 (CYANOCOBALAMIN) 1000 MCG tablet Take 1,000 mcg by mouth daily.  Marland Kitchen ALPRAZolam (XANAX) 0.25 MG tablet Take 1 tablet (0.25 mg total) by mouth 2 (two) times daily as needed for anxiety.  . meloxicam (MOBIC) 7.5 MG tablet Take 1 tablet (7.5 mg total) by mouth daily.  . [DISCONTINUED] albuterol (PROVENTIL HFA;VENTOLIN HFA) 108 (90 Base) MCG/ACT inhaler Inhale 2 puffs into the lungs every 6 (six) hours as needed for wheezing or shortness of breath.  . [DISCONTINUED] HYDROcodone-acetaminophen (NORCO) 5-325 MG tablet Take 1-2 tablets by mouth every 4 (four) hours as needed for moderate pain. (Patient not taking: Reported on 02/16/2018)   No facility-administered encounter medications on file as of 02/16/2018.     Surgical History: Past Surgical History:  Procedure Laterality Date  . COLONOSCOPY    . FUNCTIONAL ENDOSCOPIC SINUS SURGERY    . KNEE ARTHROSCOPY Right 03/04/2017   Procedure: ARTHROSCOPY KNEE, REMOVAL OF LOOSE BODY. CHONDROPLASTY MEDIAL COMPARTMENT;  Surgeon: Dereck Leep, MD;  Location: [facility] ORS;  Service: Orthopedics;  Laterality: Right;  . NOVASURE ABLATION    . TUBAL LIGATION      Medical History: Past Medical History:  Diagnosis Date  . Anemia    H/O YEARS AGO  . Cancer ([place])    SKIN CANCER ON FACE  . GERD (gastroesophageal  reflux disease)   . Hypertension   . Hypothyroidism     Family History: Family History  Problem Relation Age of Onset  . Cancer Mother   . Hypertension Mother   . Prostate cancer Father     Social History   Socioeconomic History  . Marital status: Married    Spouse name: Not on file  . Number of children: Not on file  . Years of education: Not on file  . Highest education level: Not on file  Occupational History  . Not on file  Social Needs  . Financial resource strain: Not on file  . Food insecurity:    Worry: Not on file    Inability: Not on file  . Transportation needs:     Medical: Not on file    Non-medical: Not on file  Tobacco Use  . Smoking status: Former Smoker    Packs/day: 1.00    Years: 15.00    Pack years: 15.00    Types: Cigarettes    Last attempt to quit: 07/27/2009    Years since quitting: 8.5  . Smokeless tobacco: Never Used  Substance and Sexual Activity  . Alcohol use: Yes    Comment: [place] [employer]   . Drug use: No  . Sexual activity: Not on file  Lifestyle  . Physical activity:    Days per week: Not on file    Minutes per session: Not on file  . Stress: Not on file  Relationships  . Social connections:    Talks on phone: Not on file    Gets together: Not on file    Attends religious service: Not on file    Active member of club or organization: Not on file    Attends meetings of clubs or organizations: Not on file    Relationship status: Not on file  . Intimate partner violence:    Fear of current or ex partner: Not on file    Emotionally abused: Not on file    Physically abused: Not on file    Forced sexual activity: Not on file  Other Topics Concern  . Not on file  Social History Narrative  . Not on file      Review of Systems  Constitutional: Negative for activity change, chills, fatigue and unexpected weight change.  HENT: Negative for congestion, postnasal drip, rhinorrhea, sneezing and sore throat.   Eyes: Negative.  Negative for redness.  Respiratory: Negative for cough, chest tightness, shortness of breath and wheezing.   Cardiovascular: Negative for chest pain and palpitations.  Gastrointestinal: Negative for abdominal pain, constipation, diarrhea, nausea and vomiting.  Endocrine:       Thyroid panel has been normal.   Genitourinary: Negative.  Negative for dysuria and frequency.  Musculoskeletal: Positive for arthralgias. Negative for back pain, joint swelling and neck pain.       Bilateral knee pain.   Skin: Negative for rash.  Allergic/Immunologic: Positive for environmental allergies.  Neurological:  Negative for tremors, numbness and headaches.  Hematological: Negative for adenopathy. Does not bruise/bleed easily.  Psychiatric/Behavioral: Negative for behavioral problems (Depression), sleep disturbance and suicidal ideas. The patient is nervous/anxious.        Well controlled depression.     Today's Vitals   02/16/18 0840  BP: 127/85  Pulse: 63  Resp: 16  SpO2: 95%  Weight: 188 lb (85.3 kg)  Height: 5\' 5"  (1.651 m)    Physical Exam  Constitutional: Grace Christensen is oriented to person, place, and time. Grace Christensen  appears well-developed and well-nourished. No distress.  HENT:  Head: Normocephalic and atraumatic.  Mouth/Throat: Oropharynx is clear and moist. No oropharyngeal exudate.  Eyes: Pupils are equal, round, and reactive to light. EOM are normal.  Neck: Normal range of motion. Neck supple. No JVD present. No tracheal deviation present. No thyromegaly present.  Cardiovascular: Normal rate, regular rhythm and normal heart sounds. Exam reveals no gallop and no friction rub.  No murmur heard. Pulmonary/Chest: Effort normal and breath sounds normal. No respiratory distress. Grace Christensen has no wheezes. Grace Christensen has no rales. Grace Christensen exhibits no tenderness.  Abdominal: Soft. Bowel sounds are normal. There is no tenderness.  Musculoskeletal: Normal range of motion.  Lymphadenopathy:    Grace Christensen has no cervical adenopathy.  Neurological: Grace Christensen is alert and oriented to person, place, and time. No cranial nerve deficit.  Skin: Skin is warm and dry. Grace Christensen is not diaphoretic.  Psychiatric: Grace Christensen has a normal mood and affect. Her behavior is normal. Judgment and thought content normal.  Nursing note and vitals reviewed.  Assessment/Plan: 1. Primary osteoarthritis of knees, bilateral Recently worsening.  - meloxicam (MOBIC) 7.5 MG tablet; Take 1 tablet (7.5 mg total) by mouth daily.  Dispense: 180 tablet; Refill: 1  2. Generalized anxiety disorder Continue bupropion daily as prescribed. May take alprazolam 0.25mg  twice daily  as needed for acute anxiety. New rx provided today.  - ALPRAZolam (XANAX) 0.25 MG tablet; Take 1 tablet (0.25 mg total) by mouth 2 (two) times daily as needed for anxiety.  Dispense: 135 tablet; Refill: 1  3. Acquired hypothyroidism Stable. Continue levothyroxine a prescribed.   4. Recurrent major depressive disorder, in partial remission (HCC) Stable. Continue sertraline as prescribed.   General Counseling: analeise mccleery understanding of the findings of todays visit and agrees with plan of treatment. I have discussed any further diagnostic evaluation that may be needed or ordered today. We also reviewed her medications today. Grace Christensen has been encouraged to call the office with any questions or concerns that should arise related to todays visit.  This patient was seen by Leretha Pol, FNP- C in Collaboration with Dr Lavera Guise as a part of collaborative care agreement   Meds ordered this encounter  Medications  . ALPRAZolam (XANAX) 0.25 MG tablet    Sig: Take 1 tablet (0.25 mg total) by mouth 2 (two) times daily as needed for anxiety.    Dispense:  135 tablet    Refill:  1    Order Specific Question:   Supervising Provider    Answer:   Lavera Guise [[id number]]  . meloxicam (MOBIC) 7.5 MG tablet    Sig: Take 1 tablet (7.5 mg total) by mouth daily.    Dispense:  180 tablet    Refill:  1    Order Specific Question:   Supervising Provider    Answer:   Lavera Guise [[id number]]    Time spent:15 Minutes      Dr Lavera Guise Internal medicine

## 2018-04-28 ENCOUNTER — Telehealth: Payer: Self-pay | Admitting: Nurse Practitioner

## 2018-04-28 MED ORDER — TRAZODONE HCL 100 MG PO TABS: ORAL_TABLET | ORAL | 1 refills | Status: DC

## 2018-04-28 NOTE — Telephone Encounter (Signed)
Fax request ok per [employer]

## 2018-05-19 ENCOUNTER — Other Ambulatory Visit: Payer: Self-pay

## 2018-05-19 MED ORDER — SERTRALINE HCL 100 MG PO TABS: 200.0000 mg | ORAL_TABLET | ORAL | 1 refills | Status: DC

## 2018-06-21 ENCOUNTER — Ambulatory Visit: Payer: Self-pay | Admitting: Nurse Practitioner

## 2018-07-02 DIAGNOSIS — M5417 Radiculopathy, lumbosacral region: Secondary | ICD-10-CM | POA: Insufficient documentation

## 2018-07-02 DIAGNOSIS — S76312A Strain of muscle, fascia and tendon of the posterior muscle group at thigh level, left thigh, initial encounter: Secondary | ICD-10-CM | POA: Diagnosis not present

## 2018-07-02 DIAGNOSIS — M1712 Unilateral primary osteoarthritis, left knee: Secondary | ICD-10-CM | POA: Diagnosis not present

## 2018-07-16 ENCOUNTER — Emergency Department: Payer: 59

## 2018-07-16 DIAGNOSIS — Z7982 Long term (current) use of aspirin: Secondary | ICD-10-CM | POA: Diagnosis not present

## 2018-07-16 DIAGNOSIS — Y998 Other external cause status: Secondary | ICD-10-CM | POA: Diagnosis not present

## 2018-07-16 DIAGNOSIS — E039 Hypothyroidism, unspecified: Secondary | ICD-10-CM | POA: Insufficient documentation

## 2018-07-16 DIAGNOSIS — I10 Essential (primary) hypertension: Secondary | ICD-10-CM | POA: Diagnosis not present

## 2018-07-16 DIAGNOSIS — Y9389 Activity, other specified: Secondary | ICD-10-CM | POA: Insufficient documentation

## 2018-07-16 DIAGNOSIS — Z85828 Personal history of other malignant neoplasm of skin: Secondary | ICD-10-CM | POA: Diagnosis not present

## 2018-07-16 DIAGNOSIS — M79642 Pain in left hand: Secondary | ICD-10-CM | POA: Diagnosis not present

## 2018-07-16 DIAGNOSIS — Z87891 Personal history of nicotine dependence: Secondary | ICD-10-CM | POA: Diagnosis not present

## 2018-07-16 DIAGNOSIS — Z79899 Other long term (current) drug therapy: Secondary | ICD-10-CM | POA: Insufficient documentation

## 2018-07-16 DIAGNOSIS — S40022A Contusion of left upper arm, initial encounter: Secondary | ICD-10-CM | POA: Diagnosis not present

## 2018-07-16 DIAGNOSIS — S5012XA Contusion of left forearm, initial encounter: Secondary | ICD-10-CM | POA: Diagnosis not present

## 2018-07-16 DIAGNOSIS — Y929 Unspecified place or not applicable: Secondary | ICD-10-CM | POA: Insufficient documentation

## 2018-07-16 DIAGNOSIS — S59912A Unspecified injury of left forearm, initial encounter: Secondary | ICD-10-CM | POA: Diagnosis present

## 2018-07-16 DIAGNOSIS — S6992XA Unspecified injury of left wrist, hand and finger(s), initial encounter: Secondary | ICD-10-CM | POA: Diagnosis not present

## 2018-07-16 DIAGNOSIS — M7989 Other specified soft tissue disorders: Secondary | ICD-10-CM | POA: Diagnosis not present
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
## 2018-07-17 ENCOUNTER — Emergency Department
Admission: EM | Admit: 2018-07-17 | Discharge: 2018-07-17 | Disposition: A | Discharge status: Home / Self Care | Payer: 59 | Attending: Emergency Medicine | Admitting: Emergency Medicine

## 2018-07-17 DIAGNOSIS — S40022A Contusion of left upper arm, initial encounter: Secondary | ICD-10-CM

## 2018-07-17 DIAGNOSIS — T148XXA Other injury of unspecified body region, initial encounter: Secondary | ICD-10-CM

## 2018-07-17 MED ORDER — IBUPROFEN 600 MG PO TABS
600.0000 mg | ORAL_TABLET | Freq: Once | ORAL | Status: AC
  Administered 2018-07-17: 600 mg via ORAL
  Filled 2018-07-17: qty 1

## 2018-07-17 MED ORDER — HYDROCODONE-ACETAMINOPHEN 5-325 MG PO TABS
1.0000 | ORAL_TABLET | Freq: Once | ORAL | Status: AC
  Administered 2018-07-17: 1 via ORAL
  Filled 2018-07-17: qty 1

## 2018-07-17 MED ORDER — HYDROCODONE-ACETAMINOPHEN 5-325 MG PO TABS: 1.0000 | ORAL_TABLET | Freq: Four times a day (QID) | ORAL | 0 refills | Status: DC | PRN
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
## 2018-07-26 ENCOUNTER — Other Ambulatory Visit: Payer: Self-pay

## 2018-07-26 MED ORDER — LEVOTHYROXINE SODIUM 50 MCG PO TABS: 50.0000 ug | ORAL_TABLET | Freq: Every day | ORAL | 1 refills | Status: DC

## 2018-07-26 MED ORDER — BUPROPION HCL ER (XL) 300 MG PO TB24: 300.0000 mg | ORAL_TABLET | ORAL | 1 refills | Status: DC

## 2018-07-26 MED ORDER — BISOPROLOL FUMARATE 10 MG PO TABS: 10.0000 mg | ORAL_TABLET | Freq: Every day | ORAL | 1 refills | Status: DC

## 2018-08-23 ENCOUNTER — Other Ambulatory Visit: Payer: Self-pay | Admitting: Nurse Practitioner

## 2018-08-23 MED ORDER — TRAZODONE HCL 100 MG PO TABS: ORAL_TABLET | ORAL | 1 refills | Status: DC

## 2018-12-07 ENCOUNTER — Telehealth: Payer: Self-pay

## 2018-12-07 NOTE — Telephone Encounter (Signed)
Spoke pt pt for we received from [facility]  Refills her med she said she don't need any med now and also advised pt need Appt

## 2019-01-05 ENCOUNTER — Encounter: Payer: Self-pay | Admitting: Adult Health

## 2019-01-05 ENCOUNTER — Ambulatory Visit: Payer: 59 | Admitting: Adult Health

## 2019-01-05 ENCOUNTER — Encounter (INDEPENDENT_AMBULATORY_CARE_PROVIDER_SITE_OTHER): Payer: Self-pay

## 2019-01-05 VITALS — BP 110/70 | HR 66 | Temp 98.1°F | Resp 16 | Ht 65.0 in | Wt 185.0 lb

## 2019-01-05 DIAGNOSIS — J0110 Acute frontal sinusitis, unspecified: Secondary | ICD-10-CM

## 2019-01-05 DIAGNOSIS — I10 Essential (primary) hypertension: Secondary | ICD-10-CM

## 2019-01-05 DIAGNOSIS — F411 Generalized anxiety disorder: Secondary | ICD-10-CM | POA: Diagnosis not present

## 2019-01-05 MED ORDER — AMOXICILLIN-POT CLAVULANATE 875-125 MG PO TABS: 1.0000 | ORAL_TABLET | Freq: Two times a day (BID) | ORAL | 0 refills | Status: DC

## 2019-01-05 NOTE — Progress Notes (Signed)
[facility] [place], [place] [address]  Internal MEDICINE  Office Visit Note  Patient Name: Grace Christensen  [id number]  [id number]  Date of Service: 01/05/2019  Chief Complaint  Patient presents with  . Sore Throat    started the last couple days getting worse , 2 weeks with symptoms   . Cough     HPI Patient is here for sick visit.  She reports 2 weeks worth of sinus symptoms.  She reports that the sore throat began over the last couple of days and has been getting persistently worse.  She does not have any pain with swallowing she mostly describes it as a "raw" feeling in her throat.  She reports it is intermittent and not always present.  She has been using Mucinex with out much relief.  She denies any fever or chills.    Current Medication:  Outpatient Encounter Medications as of 01/05/2019  Medication Sig  . ALPRAZolam (XANAX) 0.25 MG tablet Take 1 tablet (0.25 mg total) by mouth 2 (two) times daily as needed for anxiety.  Marland Kitchen aspirin EC 81 MG tablet Take 81 mg by mouth daily.  . B Complex Vitamins (B COMPLEX PO) Place 1 mL under the tongue daily.  . Biotin 1000 MCG tablet Take 1,000 mcg by mouth daily.  . bisoprolol (ZEBETA) 10 MG tablet Take 1 tablet (10 mg total) by mouth at bedtime.  Marland Kitchen buPROPion (WELLBUTRIN XL) 300 MG 24 hr tablet Take 1 tablet (300 mg total) by mouth every morning.  . Calcium Carb-Cholecalciferol (CALCIUM 600+D3) 600-800 MG-UNIT TABS Take 1 tablet by mouth daily.  . Cholecalciferol (VITAMIN D) 2000 units tablet Take 2,000 Units by mouth daily.  . fexofenadine (ALLEGRA) 180 MG tablet Take 180 mg by mouth every morning. Allergy Relief   . HYDROcodone-acetaminophen (NORCO) 5-325 MG tablet Take 1 tablet by mouth every 6 (six) hours as needed for up to 7 doses for severe pain.  Marland Kitchen levothyroxine (SYNTHROID, LEVOTHROID) 50 MCG tablet Take 1 tablet (50 mcg total) by mouth at bedtime.  . meloxicam (MOBIC) 7.5 MG tablet Take 1 tablet (7.5 mg  total) by mouth daily.  . Omega-3 Fatty Acids (OMEGA-3 FISH OIL) 1200 MG CAPS Take 1-2 capsules by mouth See admin instructions. 1 capsule in the morning, and 2 capsules in the evening  . Potassium 99 MG TABS Take 1 tablet by mouth daily.  . sertraline (ZOLOFT) 100 MG tablet Take 2 tablets (200 mg total) by mouth every morning.  . traZODone (DESYREL) 100 MG tablet TAKE 2 TO 3 TABLETS BY  MOUTH EVERY NIGHT AT  BEDTIME AS NEEDED FOR  INSOMNIA  . vitamin B-12 (CYANOCOBALAMIN) 1000 MCG tablet Take 1,000 mcg by mouth daily.  Marland Kitchen amoxicillin-clavulanate (AUGMENTIN) 875-125 MG tablet Take 1 tablet by mouth 2 (two) times daily.   No facility-administered encounter medications on file as of 01/05/2019.       Medical History: Past Medical History:  Diagnosis Date  . Anemia    H/O YEARS AGO  . Cancer ([place])    SKIN CANCER ON FACE  . GERD (gastroesophageal reflux disease)   . Hypertension   . Hypothyroidism      Vital Signs: BP 110/70   Pulse 66   Temp 98.1 F (36.7 C) (Oral)   Resp 16   Ht 5\' 5"  (1.651 m)   Wt 185 lb (83.9 kg)   SpO2 97%   BMI 30.79 kg/m    Review of Systems  Constitutional:  Negative for chills, fatigue and unexpected weight change.  HENT: Negative for congestion, rhinorrhea, sneezing and sore throat.   Eyes: Negative for photophobia, pain and redness.  Respiratory: Negative for cough, chest tightness and shortness of breath.   Cardiovascular: Negative for chest pain and palpitations.  Gastrointestinal: Negative for abdominal pain, constipation, diarrhea, nausea and vomiting.  Endocrine: Negative.   Genitourinary: Negative for dysuria and frequency.  Musculoskeletal: Negative for arthralgias, back pain, joint swelling and neck pain.  Skin: Negative for rash.  Allergic/Immunologic: Negative.   Neurological: Negative for tremors and numbness.  Hematological: Negative for adenopathy. Does not bruise/bleed easily.  Psychiatric/Behavioral: Negative for behavioral  problems and sleep disturbance. The patient is not nervous/anxious.     Physical Exam Vitals signs and nursing note reviewed.  Constitutional:      General: She is not in acute distress.    Appearance: She is well-developed. She is not diaphoretic.  HENT:     Head: Normocephalic and atraumatic.     Mouth/Throat:     Pharynx: No oropharyngeal exudate.  Eyes:     Pupils: Pupils are equal, round, and reactive to light.  Neck:     Musculoskeletal: Normal range of motion and neck supple.     Thyroid: No thyromegaly.     Vascular: No JVD.     Trachea: No tracheal deviation.  Cardiovascular:     Rate and Rhythm: Normal rate and regular rhythm.     Heart sounds: Normal heart sounds. No murmur. No friction rub. No gallop.   Pulmonary:     Effort: Pulmonary effort is normal. No respiratory distress.     Breath sounds: Normal breath sounds. No wheezing or rales.  Chest:     Chest wall: No tenderness.  Abdominal:     Palpations: Abdomen is soft.     Tenderness: There is no abdominal tenderness. There is no guarding.  Musculoskeletal: Normal range of motion.  Lymphadenopathy:     Cervical: No cervical adenopathy.  Skin:    General: Skin is warm and dry.  Neurological:     Mental Status: She is alert and oriented to person, place, and time.     Cranial Nerves: No cranial nerve deficit.  Psychiatric:        Behavior: Behavior normal.        Thought Content: Thought content normal.        Judgment: Judgment normal.    Assessment/Plan: 1. Acute non-recurrent frontal sinusitis Been given course of Augmentin.  Instructed patient to take all medication until completed.  She will return to clinic in 7 to 10 days if symptoms fail to improve. - amoxicillin-clavulanate (AUGMENTIN) 875-125 MG tablet; Take 1 tablet by mouth 2 (two) times daily.  Dispense: 14 tablet; Refill: 0  2. Hypertension, unspecified type Stable, continue current medication regimen.  Continue supportive measures.  3.  Generalized anxiety disorder Stable, no complaint at this time.  Continue current medication regimen  General Counseling: Grace Christensen verbalizes understanding of the findings of todays visit and agrees with plan of treatment. I have discussed any further diagnostic evaluation that may be needed or ordered today. We also reviewed her medications today. she has been encouraged to call the office with any questions or concerns that should arise related to todays visit.   No orders of the defined types were placed in this encounter.   Meds ordered this encounter  Medications  . amoxicillin-clavulanate (AUGMENTIN) 875-125 MG tablet    Sig: Take 1 tablet by mouth 2 (  two) times daily.    Dispense:  14 tablet    Refill:  0    Time spent: 25 Minutes  This patient was seen by Orson Gear AGNP-C in Collaboration with Dr Lavera Guise as a part of collaborative care agreement.  Kendell Bane AGNP-C Internal Medicine

## 2019-01-05 NOTE — Patient Instructions (Signed)

## 2019-01-06 ENCOUNTER — Other Ambulatory Visit: Payer: Self-pay | Admitting: Internal Medicine

## 2019-01-11 IMAGING — MR MR KNEE*R* W/O CM
4 of 6 series · 13 of 40 positions shown · non-contrast
Comparison: None.

CLINICAL DATA: Right knee pain x1 month without known injury.
Internal derangement of the right knee.

EXAM:
MRI OF THE RIGHT KNEE WITHOUT CONTRAST
TECHNIQUE: Multiplanar, multisequence MR imaging of the knee was performed. No
intravenous contrast was administered.

[Series 3: pdfs axial · axial · 3.0mm · 0.22mm/px · z∈[-54,+18]mm · 3 of 30 slices shown]
[im 5/30]
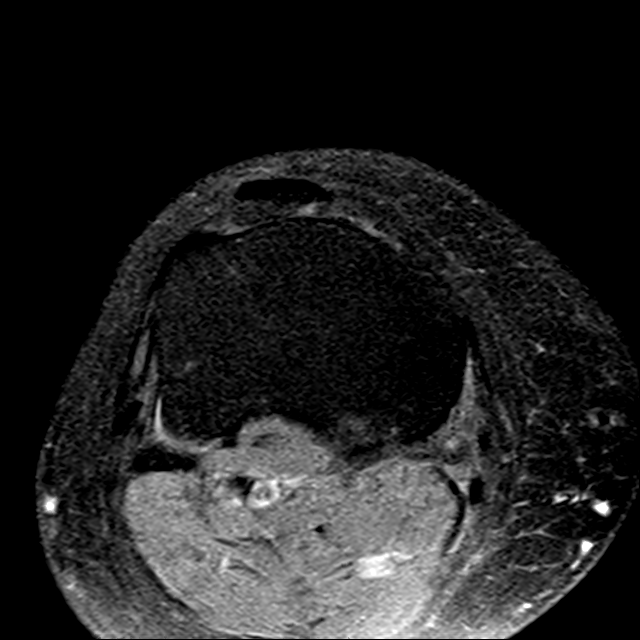
[im 17/30]
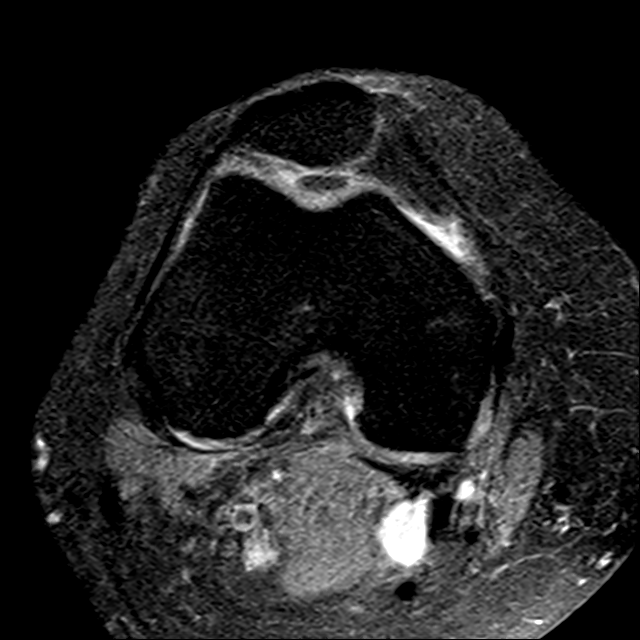
[im 25/30]
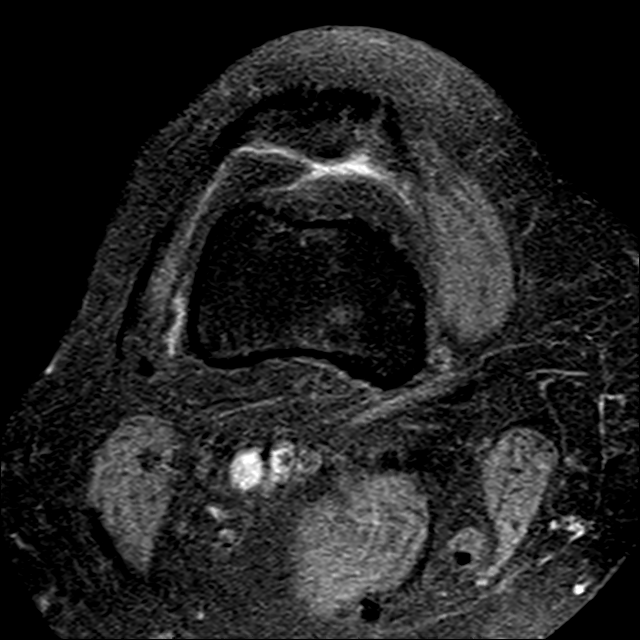

[Series 4: T1 · coronal · 3.0mm · 0.21mm/px · 4 of 26 slices shown]
[im 1/26]
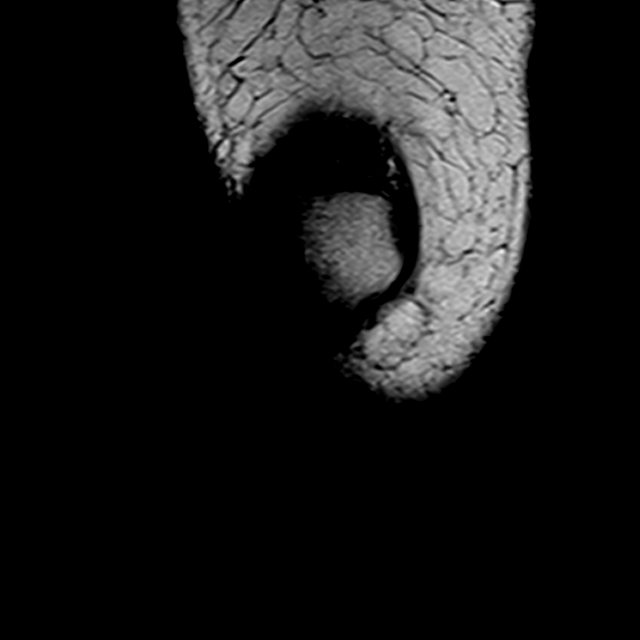
[im 6/26]
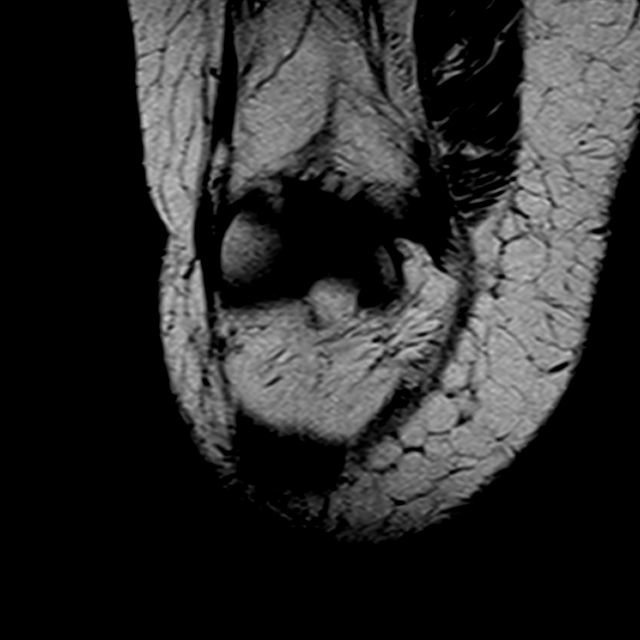
[im 16/26]
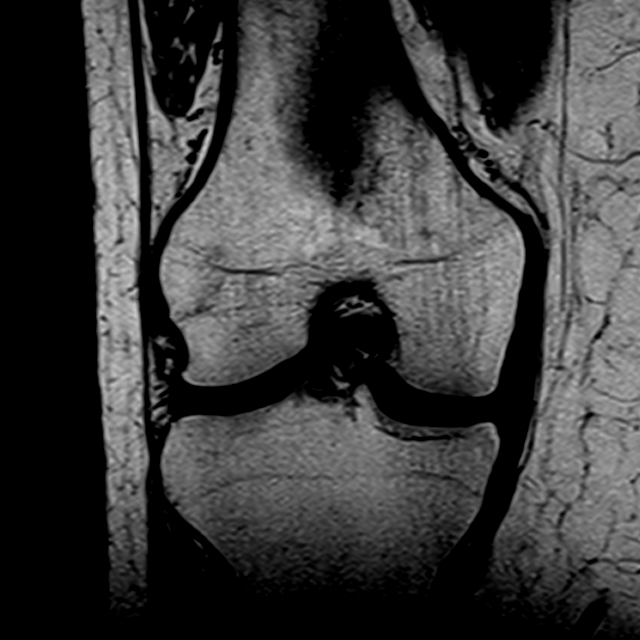
[im 26/26]
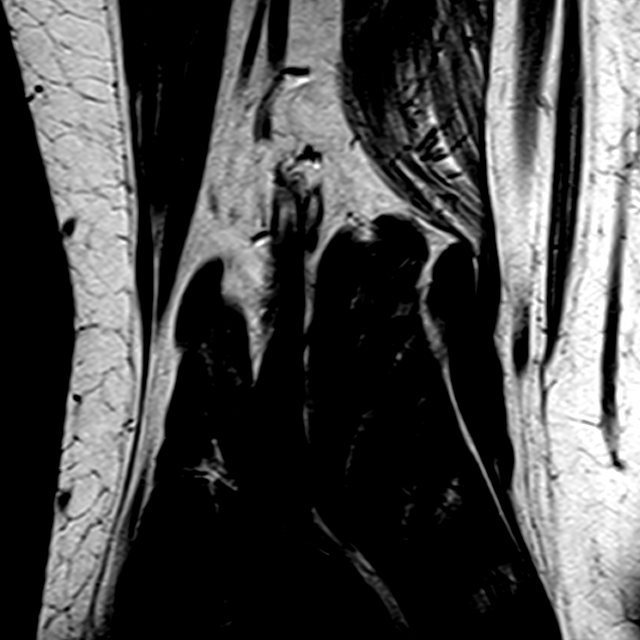

[Series 5: pdfs sag · sagittal · 3.0mm · 0.20mm/px · 3 of 29 slices shown]
[im 5/29]
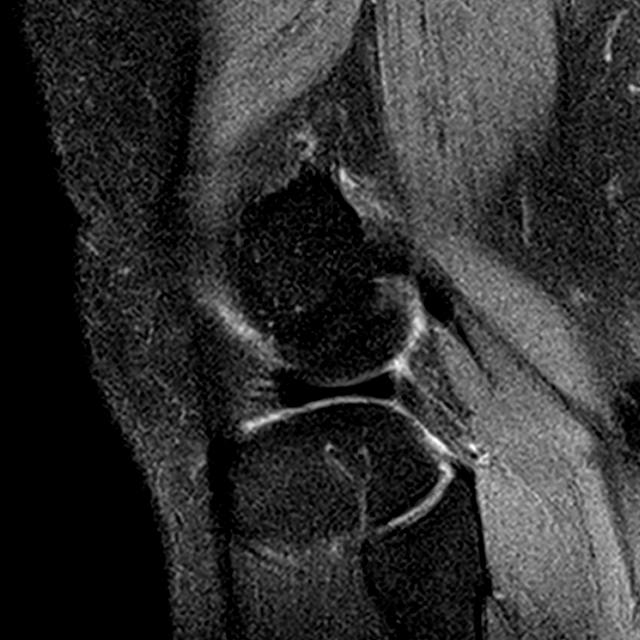
[im 15/29]
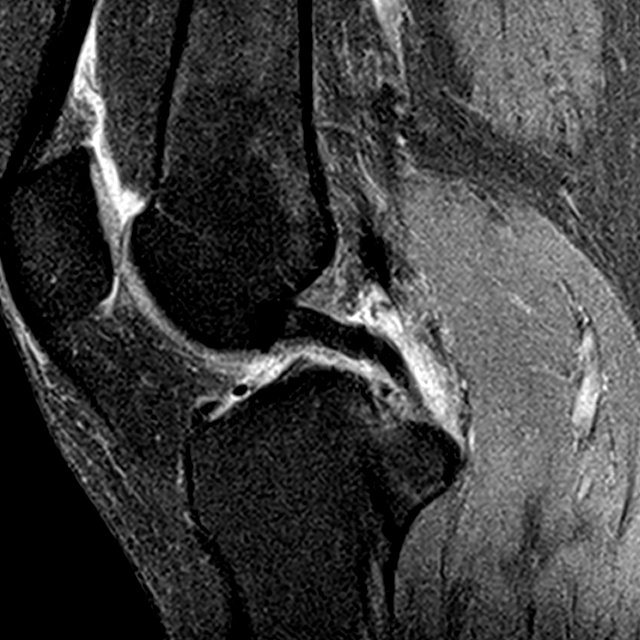
[im 24/29]
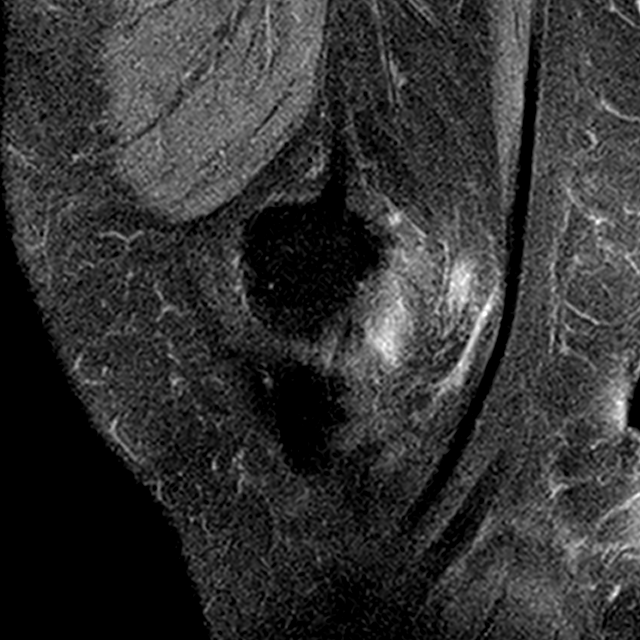

[Series 6: t2fs cor · coronal · 3.0mm · 0.21mm/px · 3 of 24 slices shown]
[im 5/24]
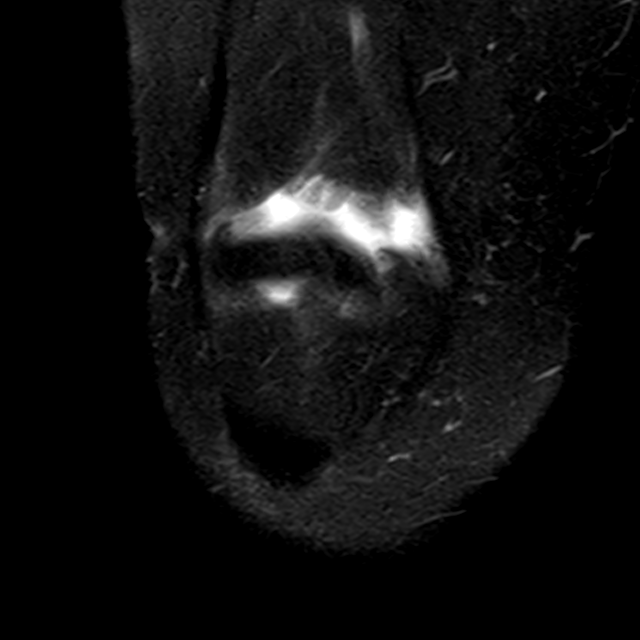
[im 14/24]
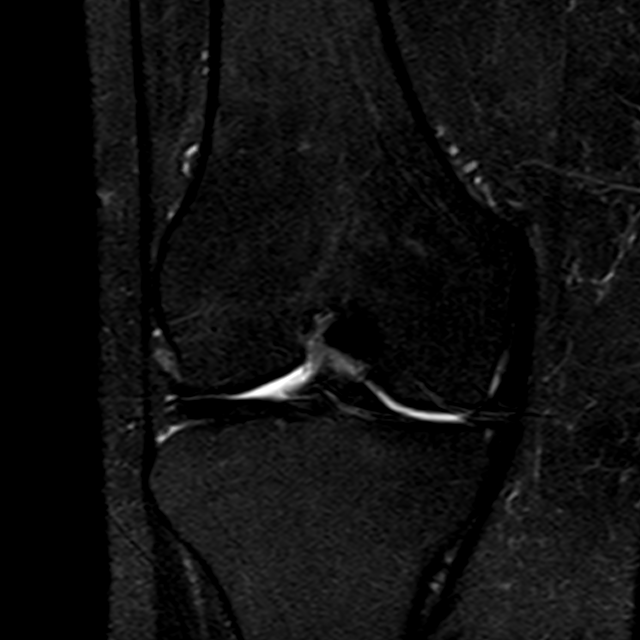
[im 24/24]
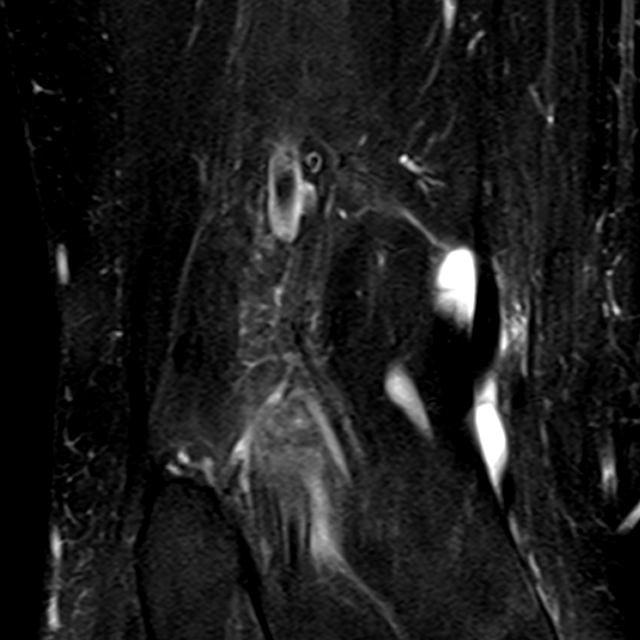

[13 of 40 positions shown; findings below may reference images not displayed]

FINDINGS: MENISCI

Medial meniscus:  Intact

Lateral meniscus:  Intact.

LIGAMENTS

Cruciates:  Intact

Collaterals: Medial collateral ligament is intact. Lateral
collateral ligament complex is intact.

CARTILAGE

Patellofemoral:  No focal chondral defect.

Medial: Partial irregular cartilaginous thinning along the
weight-bearing portion of the medial femoral condylar cartilage. No
focal chondral defects.

Lateral: Slight fissuring of the lateral femoral condylar cartilage
with slight thinning. No focal chondral defects.

Joint:  Trace joint effusion.  Normal Hoffa's fat pad.  No plica.

Popliteal Fossa:  Small popliteal cyst.  Intact popliteus.

Extensor Mechanism:  Intact quadriceps tendon and patellar tendon.

Bones: Minor degenerative reactive marrow edema deep to the tibial
spine. No acute appearing osseous abnormality.

Other: Mild subcutaneous edema overlying the patellar tendon.
IMPRESSION: Intact cruciate and collateral ligaments.  Intact menisci.

Partial irregular cartilaginous thinning of the medial and lateral
femoral condylar cartilage.

Small popliteal cyst.

## 2019-01-21 ENCOUNTER — Other Ambulatory Visit: Payer: Self-pay | Admitting: Nurse Practitioner

## 2019-01-26 ENCOUNTER — Ambulatory Visit (INDEPENDENT_AMBULATORY_CARE_PROVIDER_SITE_OTHER): Payer: 59 | Admitting: Nurse Practitioner

## 2019-01-26 ENCOUNTER — Encounter: Payer: Self-pay | Admitting: Nurse Practitioner

## 2019-01-26 VITALS — BP 121/87 | HR 66 | Resp 16 | Ht 65.0 in | Wt 187.4 lb

## 2019-01-26 DIAGNOSIS — J0110 Acute frontal sinusitis, unspecified: Secondary | ICD-10-CM | POA: Diagnosis not present

## 2019-01-26 DIAGNOSIS — Z124 Encounter for screening for malignant neoplasm of cervix: Secondary | ICD-10-CM

## 2019-01-26 DIAGNOSIS — I10 Essential (primary) hypertension: Secondary | ICD-10-CM

## 2019-01-26 DIAGNOSIS — F411 Generalized anxiety disorder: Secondary | ICD-10-CM | POA: Insufficient documentation

## 2019-01-26 DIAGNOSIS — Z13820 Encounter for screening for osteoporosis: Secondary | ICD-10-CM

## 2019-01-26 DIAGNOSIS — R300 Dysuria: Secondary | ICD-10-CM

## 2019-01-26 DIAGNOSIS — F5101 Primary insomnia: Secondary | ICD-10-CM

## 2019-01-26 DIAGNOSIS — E039 Hypothyroidism, unspecified: Secondary | ICD-10-CM | POA: Diagnosis not present

## 2019-01-26 DIAGNOSIS — Z0001 Encounter for general adult medical examination with abnormal findings: Secondary | ICD-10-CM

## 2019-01-26 DIAGNOSIS — Z1239 Encounter for other screening for malignant neoplasm of breast: Secondary | ICD-10-CM

## 2019-01-26 MED ORDER — BISOPROLOL FUMARATE 10 MG PO TABS: 10.0000 mg | ORAL_TABLET | Freq: Every day | ORAL | 3 refills | Status: DC

## 2019-01-26 MED ORDER — AMOXICILLIN-POT CLAVULANATE 875-125 MG PO TABS: 1.0000 | ORAL_TABLET | Freq: Two times a day (BID) | ORAL | 0 refills | Status: DC

## 2019-01-26 MED ORDER — SERTRALINE HCL 100 MG PO TABS: 200.0000 mg | ORAL_TABLET | ORAL | 3 refills | Status: DC

## 2019-01-26 MED ORDER — BUPROPION HCL ER (XL) 300 MG PO TB24: 300.0000 mg | ORAL_TABLET | ORAL | 3 refills | Status: DC

## 2019-01-26 MED ORDER — TRAZODONE HCL 100 MG PO TABS: ORAL_TABLET | ORAL | 3 refills | Status: DC

## 2019-01-26 MED ORDER — LEVOTHYROXINE SODIUM 50 MCG PO TABS: 50.0000 ug | ORAL_TABLET | Freq: Every day | ORAL | 1 refills | Status: DC

## 2019-01-26 MED ORDER — ALPRAZOLAM 0.25 MG PO TABS: 0.2500 mg | ORAL_TABLET | Freq: Two times a day (BID) | ORAL | 3 refills | Status: DC | PRN

## 2019-01-26 NOTE — Progress Notes (Signed)
[facility] [place], [place] [address]  Internal MEDICINE  Office Visit Note  Patient Name: Grace Christensen  [id number]  [id number]  Date of Service: 01/26/2019   Pt is here for routine health maintenance examination   Chief Complaint  Patient presents with  . Annual Exam    Medication refills  . Asthma  . Gastroesophageal Reflux  . Hypertension  . Quality Metric Gaps    mammogram     The patient is here for health maintenance exam. Doing well. No physical concerns or complaints. She does need to have refills of routine medications. Is treated for anxiety/depression and she is doing well on current medications. She is due for pap smear has history of HPV positive pap smears without evidence of cervical cancer or precancerous cells. She is due to have screening mammogram as well as bone density test .   Current Medication: Outpatient Encounter Medications as of 01/26/2019  Medication Sig  . ALPRAZolam (XANAX) 0.25 MG tablet Take 1 tablet (0.25 mg total) by mouth 2 (two) times daily as needed for anxiety.  Marland Kitchen aspirin EC 81 MG tablet Take 81 mg by mouth daily.  . B Complex Vitamins (B COMPLEX PO) Place 1 mL under the tongue daily.  . Biotin 1000 MCG tablet Take 1,000 mcg by mouth daily.  . bisoprolol (ZEBETA) 10 MG tablet Take 1 tablet (10 mg total) by mouth at bedtime.  Marland Kitchen buPROPion (WELLBUTRIN XL) 300 MG 24 hr tablet Take 1 tablet (300 mg total) by mouth every morning.  . Calcium Carb-Cholecalciferol (CALCIUM 600+D3) 600-800 MG-UNIT TABS Take 1 tablet by mouth daily.  . Cholecalciferol (VITAMIN D) 2000 units tablet Take 2,000 Units by mouth daily.  . fexofenadine (ALLEGRA) 180 MG tablet Take 180 mg by mouth every morning. Allergy Relief   . HYDROcodone-acetaminophen (NORCO) 5-325 MG tablet Take 1 tablet by mouth every 6 (six) hours as needed for up to 7 doses for severe pain.  Marland Kitchen levothyroxine (SYNTHROID, LEVOTHROID) 50 MCG tablet Take 1 tablet (50 mcg  total) by mouth at bedtime.  . meloxicam (MOBIC) 7.5 MG tablet Take 1 tablet (7.5 mg total) by mouth daily.  . Omega-3 Fatty Acids (OMEGA-3 FISH OIL) 1200 MG CAPS Take 1-2 capsules by mouth See admin instructions. 1 capsule in the morning, and 2 capsules in the evening  . Potassium 99 MG TABS Take 1 tablet by mouth daily.  . sertraline (ZOLOFT) 100 MG tablet Take 2 tablets (200 mg total) by mouth every morning.  . traZODone (DESYREL) 100 MG tablet Take 3 tablets po QHS prn  . vitamin B-12 (CYANOCOBALAMIN) 1000 MCG tablet Take 1,000 mcg by mouth daily.  . [DISCONTINUED] ALPRAZolam (XANAX) 0.25 MG tablet Take 1 tablet (0.25 mg total) by mouth 2 (two) times daily as needed for anxiety.  . [DISCONTINUED] bisoprolol (ZEBETA) 10 MG tablet Take 1 tablet (10 mg total) by mouth at bedtime.  . [DISCONTINUED] buPROPion (WELLBUTRIN XL) 300 MG 24 hr tablet Take 1 tablet (300 mg total) by mouth every morning.  . [DISCONTINUED] levothyroxine (SYNTHROID, LEVOTHROID) 50 MCG tablet Take 1 tablet (50 mcg total) by mouth at bedtime.  . [DISCONTINUED] sertraline (ZOLOFT) 100 MG tablet Take 2 tablets (200 mg total) by mouth every morning.  . [DISCONTINUED] traZODone (DESYREL) 100 MG tablet TAKE 2 TO 3 TABLETS BY  MOUTH EVERY NIGHT AT  BEDTIME AS NEEDED FOR  INSOMNIA  . amoxicillin-clavulanate (AUGMENTIN) 875-125 MG tablet Take 1 tablet by mouth 2 (two) times  daily.  . [DISCONTINUED] amoxicillin-clavulanate (AUGMENTIN) 875-125 MG tablet Take 1 tablet by mouth 2 (two) times daily. (Patient not taking: Reported on 01/26/2019)   No facility-administered encounter medications on file as of 01/26/2019.     Surgical History: Past Surgical History:  Procedure Laterality Date  . COLONOSCOPY    . FUNCTIONAL ENDOSCOPIC SINUS SURGERY    . KNEE ARTHROSCOPY Right 03/04/2017   Procedure: ARTHROSCOPY KNEE, REMOVAL OF LOOSE BODY. CHONDROPLASTY MEDIAL COMPARTMENT;  Surgeon: Dereck Leep, MD;  Location: [facility] ORS;  Service:  Orthopedics;  Laterality: Right;  . NOVASURE ABLATION    . TUBAL LIGATION      Medical History: Past Medical History:  Diagnosis Date  . Anemia    H/O YEARS AGO  . Cancer ([place])    SKIN CANCER ON FACE  . GERD (gastroesophageal reflux disease)   . Hypertension   . Hypothyroidism     Family History: Family History  Problem Relation Age of Onset  . Cancer Mother   . Hypertension Mother   . Prostate cancer Father       Review of Systems  Constitutional: Negative for activity change, chills, fatigue and unexpected weight change.  HENT: Positive for congestion, postnasal drip, rhinorrhea, sinus pressure and sore throat. Negative for sneezing.   Respiratory: Positive for cough. Negative for chest tightness and shortness of breath.   Cardiovascular: Negative for chest pain and palpitations.  Gastrointestinal: Negative for abdominal pain, constipation, diarrhea, nausea and vomiting.  Endocrine: Negative for cold intolerance, heat intolerance, polydipsia and polyuria.  Genitourinary: Negative for dysuria, frequency and urgency.  Musculoskeletal: Negative for arthralgias, back pain, joint swelling and neck pain.  Skin: Negative for rash.  Allergic/Immunologic: Positive for environmental allergies.  Neurological: Positive for headaches. Negative for tremors and numbness.  Hematological: Positive for adenopathy. Does not bruise/bleed easily.  Psychiatric/Behavioral: Positive for dysphoric mood. Negative for behavioral problems (Depression), sleep disturbance and suicidal ideas. The patient is nervous/anxious.        Symptoms are well managed on current medications.      Vital Signs: BP 121/87 (BP Location: Right Arm, Patient Position: Sitting, Cuff Size: Large)   Pulse 66   Resp 16   Ht 5\' 5"  (1.651 m)   Wt 187 lb 6.4 oz (85 kg)   SpO2 95%   BMI 31.18 kg/m    Physical Exam Vitals signs and nursing note reviewed.  Constitutional:      General: She is not in acute  distress.    Appearance: Normal appearance. She is well-developed. She is not diaphoretic.  HENT:     Head: Normocephalic and atraumatic.     Right Ear: Tympanic membrane is erythematous and bulging.     Left Ear: Tympanic membrane is erythematous and bulging.     Nose: Congestion present.     Right Sinus: Maxillary sinus tenderness and frontal sinus tenderness present.     Left Sinus: Maxillary sinus tenderness and frontal sinus tenderness present.     Mouth/Throat:     Pharynx: Posterior oropharyngeal erythema present. No oropharyngeal exudate.  Eyes:     Pupils: Pupils are equal, round, and reactive to light.  Neck:     Musculoskeletal: Normal range of motion and neck supple.     Thyroid: No thyromegaly.     Vascular: No carotid bruit or JVD.     Trachea: No tracheal deviation.  Cardiovascular:     Rate and Rhythm: Normal rate and regular rhythm.     Pulses: Normal pulses.  Heart sounds: Normal heart sounds. No murmur. No friction rub. No gallop.   Pulmonary:     Effort: Pulmonary effort is normal. No respiratory distress.     Breath sounds: No wheezing or rales.  Chest:     Chest wall: No tenderness.     Breasts:        Right: No swelling, bleeding, inverted nipple, mass, nipple discharge, skin change or tenderness.        Left: Normal. No swelling, bleeding, inverted nipple, mass, nipple discharge, skin change or tenderness.  Abdominal:     General: Bowel sounds are normal.     Palpations: Abdomen is soft.     Tenderness: There is no abdominal tenderness.  Genitourinary:    Exam position: Supine.     Vagina: Normal.     Cervix: No cervical motion tenderness, discharge or erythema.     Uterus: Normal.      Adnexa: Right adnexa normal and left adnexa normal.     Comments: No tenderness, masses, or organomeglay present during bimanual exam . Musculoskeletal: Normal range of motion.  Lymphadenopathy:     Cervical: Cervical adenopathy present.  Skin:    General: Skin  is warm and dry.  Neurological:     Mental Status: She is alert and oriented to person, place, and time.     Cranial Nerves: No cranial nerve deficit.  Psychiatric:        Behavior: Behavior normal.        Thought Content: Thought content normal.        Judgment: Judgment normal.    Assessment/Plan: 1. Encounter for general adult medical examination with abnormal findings Annual health maintenance exam   2. Acute non-recurrent frontal sinusitis Repeat augmentin 875mg  bid for 10 days. Use OTC medication to treat acute symptoms.  - amoxicillin-clavulanate (AUGMENTIN) 875-125 MG tablet; Take 1 tablet by mouth 2 (two) times daily.  Dispense: 20 tablet; Refill: 0  3. Hypertension, unspecified type Stable. Continue bp medication as prescribed  - bisoprolol (ZEBETA) 10 MG tablet; Take 1 tablet (10 mg total) by mouth at bedtime.  Dispense: 90 tablet; Refill: 3  4. Acquired hypothyroidism Check thyroid panel and adjust levothyroxine as indicated.  - levothyroxine (SYNTHROID, LEVOTHROID) 50 MCG tablet; Take 1 tablet (50 mcg total) by mouth at bedtime.  Dispense: 90 tablet; Refill: 1  5. Generalized anxiety disorder Continue sertraline and wellbutrin as prescribed. May take alprazolam 0.25mg  twice daily if needed for acute anxiety. Refills were sent for all medication today.  - ALPRAZolam (XANAX) 0.25 MG tablet; Take 1 tablet (0.25 mg total) by mouth 2 (two) times daily as needed for anxiety.  Dispense: 60 tablet; Refill: 3 - buPROPion (WELLBUTRIN XL) 300 MG 24 hr tablet; Take 1 tablet (300 mg total) by mouth every morning.  Dispense: 90 tablet; Refill: 3 - sertraline (ZOLOFT) 100 MG tablet; Take 2 tablets (200 mg total) by mouth every morning.  Dispense: 180 tablet; Refill: 3  6. Primary insomnia - traZODone (DESYREL) 100 MG tablet; Take 3 tablets po QHS prn  Dispense: 270 tablet; Refill: 3  7. Routine cervical smear - Pap IG and HPV (high risk) DNA detection  8. Screening for  osteoporosis - DG Bone Density; Future  9. Screening for breast cancer - MM DIGITAL SCREENING BILATERAL; Future  10. Dysuria - UA/M w/rflx Culture, Routine  General Counseling: Kelbi verbalizes understanding of the findings of todays visit and agrees with plan of treatment. I have discussed any further diagnostic evaluation that  may be needed or ordered today. We also reviewed her medications today. she has been encouraged to call the office with any questions or concerns that should arise related to todays visit.    Counseling:  Hypertension Counseling:   The following hypertensive lifestyle modification were recommended and discussed:  1. Limiting alcohol intake to less than 1 oz/day of ethanol:(24 oz of beer or 8 oz of wine or 2 oz of 100-proof whiskey). 2. Take baby ASA 81 mg daily. 3. Importance of regular aerobic exercise and losing weight. 4. Reduce dietary saturated fat and cholesterol intake for overall cardiovascular health. 5. Maintaining adequate dietary potassium, calcium, and magnesium intake. 6. Regular monitoring of the blood pressure. 7. Reduce sodium intake to less than 100 mmol/day (less than 2.3 gm of sodium or less than 6 gm of sodium choride)   This patient was seen by [place] with Dr Lavera Guise as a part of collaborative care agreement  Orders Placed This Encounter  Procedures  . MM DIGITAL SCREENING BILATERAL  . DG Bone Density  . UA/M w/rflx Culture, Routine    Meds ordered this encounter  Medications  . ALPRAZolam (XANAX) 0.25 MG tablet    Sig: Take 1 tablet (0.25 mg total) by mouth 2 (two) times daily as needed for anxiety.    Dispense:  60 tablet    Refill:  3    Order Specific Question:   Supervising Provider    Answer:   Lavera Guise [[id number]]  . bisoprolol (ZEBETA) 10 MG tablet    Sig: Take 1 tablet (10 mg total) by mouth at bedtime.    Dispense:  90 tablet    Refill:  3    Order Specific Question:   Supervising  Provider    Answer:   Lavera Guise [[id number]]  . buPROPion (WELLBUTRIN XL) 300 MG 24 hr tablet    Sig: Take 1 tablet (300 mg total) by mouth every morning.    Dispense:  90 tablet    Refill:  3    Order Specific Question:   Supervising Provider    Answer:   Lavera Guise [[id number]]  . levothyroxine (SYNTHROID, LEVOTHROID) 50 MCG tablet    Sig: Take 1 tablet (50 mcg total) by mouth at bedtime.    Dispense:  90 tablet    Refill:  1    Order Specific Question:   Supervising Provider    Answer:   Lavera Guise [[id number]]  . sertraline (ZOLOFT) 100 MG tablet    Sig: Take 2 tablets (200 mg total) by mouth every morning.    Dispense:  180 tablet    Refill:  3    Order Specific Question:   Supervising Provider    Answer:   Lavera Guise [[id number]]  . traZODone (DESYREL) 100 MG tablet    Sig: Take 3 tablets po QHS prn    Dispense:  270 tablet    Refill:  3    Order Specific Question:   Supervising Provider    Answer:   Lavera Guise [place]  . amoxicillin-clavulanate (AUGMENTIN) 875-125 MG tablet    Sig: Take 1 tablet by mouth 2 (two) times daily.    Dispense:  20 tablet    Refill:  0    Order Specific Question:   Supervising Provider    Answer:   Lavera Guise [[id number]]    Time spent: 41 Minutes      Lavera Guise, MD  Internal Medicine

## 2019-01-27 LAB — MICROSCOPIC EXAMINATION

## 2019-01-27 LAB — UA/M W/RFLX CULTURE, ROUTINE
BILIRUBIN UA: NEGATIVE
GLUCOSE, UA: NEGATIVE
KETONES UA: NEGATIVE
LEUKOCYTES UA: NEGATIVE
Nitrite, UA: NEGATIVE
PROTEIN UA: NEGATIVE
SPEC GRAV UA: 1.015 (ref 1.005–1.030)
UUROB: 0.2 mg/dL (ref 0.2–1.0)
pH, UA: 7.5 (ref 5.0–7.5)

## 2019-01-28 ENCOUNTER — Other Ambulatory Visit: Payer: Self-pay | Admitting: Nurse Practitioner

## 2019-01-28 DIAGNOSIS — E069 Thyroiditis, unspecified: Secondary | ICD-10-CM | POA: Diagnosis not present

## 2019-01-28 DIAGNOSIS — I10 Essential (primary) hypertension: Secondary | ICD-10-CM | POA: Diagnosis not present

## 2019-01-28 DIAGNOSIS — Z0001 Encounter for general adult medical examination with abnormal findings: Secondary | ICD-10-CM | POA: Diagnosis not present

## 2019-01-29 LAB — COMPREHENSIVE METABOLIC PANEL
ALK PHOS: 74 IU/L (ref 39–117)
ALT: 13 IU/L (ref 0–32)
AST: 13 IU/L (ref 0–40)
Albumin/Globulin Ratio: 1.7 (ref 1.2–2.2)
Albumin: 4.4 g/dL (ref 3.8–4.9)
BUN/Creatinine Ratio: 17 (ref 9–23)
BUN: 9 mg/dL (ref 6–24)
Bilirubin Total: 0.4 mg/dL (ref 0.0–1.2)
CO2: 25 mmol/L (ref 20–29)
Calcium: 9.5 mg/dL (ref 8.7–10.2)
Chloride: 100 mmol/L (ref 96–106)
Creatinine, Ser: 0.52 mg/dL — ABNORMAL LOW (ref 0.57–1.00)
GFR calc Af Amer: 122 mL/min/{1.73_m2} (ref >59)
GFR calc non Af Amer: 106 mL/min/{1.73_m2} (ref >59)
Globulin, Total: 2.6 g/dL (ref 1.5–4.5)
Glucose: 76 mg/dL (ref 65–99)
POTASSIUM: 4.2 mmol/L (ref 3.5–5.2)
Sodium: 139 mmol/L (ref 134–144)
Total Protein: 7 g/dL (ref 6.0–8.5)

## 2019-01-29 LAB — CBC
Hematocrit: 41 % (ref 34.0–46.6)
Hemoglobin: 14 g/dL (ref 11.1–15.9)
MCH: 30.4 pg (ref 26.6–33.0)
MCHC: 34.1 g/dL (ref 31.5–35.7)
MCV: 89 fL (ref 79–97)
Platelets: 154 10*3/uL (ref 150–450)
RBC: 4.6 x10E6/uL (ref 3.77–5.28)
RDW: 12.6 % (ref 11.7–15.4)
WBC: 4.4 10*3/uL (ref 3.4–10.8)

## 2019-01-29 LAB — T4, FREE: Free T4: 1.44 ng/dL (ref 0.82–1.77)

## 2019-01-29 LAB — VITAMIN D 25 HYDROXY (VIT D DEFICIENCY, FRACTURES): Vit D, 25-Hydroxy: 66.1 ng/mL (ref 30.0–100.0)

## 2019-01-29 LAB — LIPID PANEL W/O CHOL/HDL RATIO
CHOLESTEROL TOTAL: 183 mg/dL (ref 100–199)
HDL: 41 mg/dL (ref >39)
LDL Calculated: 116 mg/dL — ABNORMAL HIGH (ref 0–99)
Triglycerides: 128 mg/dL (ref 0–149)
VLDL Cholesterol Cal: 26 mg/dL (ref 5–40)

## 2019-01-29 LAB — T3: T3, Total: 167 ng/dL (ref 71–180)

## 2019-01-29 LAB — TSH: TSH: 0.805 u[IU]/mL (ref 0.450–4.500)

## 2019-01-31 ENCOUNTER — Telehealth: Payer: Self-pay

## 2019-01-31 LAB — HPV, LOW VOLUME (REFLEX)

## 2019-01-31 LAB — PAP IG AND HPV HIGH-RISK

## 2019-01-31 NOTE — Telephone Encounter (Signed)
Informed pt of pap results 

## 2019-01-31 NOTE — Telephone Encounter (Signed)
Informed pt that her lab work looks good

## 2019-02-14 DIAGNOSIS — E2839 Other primary ovarian failure: Secondary | ICD-10-CM | POA: Diagnosis not present

## 2019-02-14 DIAGNOSIS — Z1231 Encounter for screening mammogram for malignant neoplasm of breast: Secondary | ICD-10-CM | POA: Diagnosis not present

## 2019-02-14 DIAGNOSIS — Z780 Asymptomatic menopausal state: Secondary | ICD-10-CM | POA: Diagnosis not present

## 2019-05-31 ENCOUNTER — Ambulatory Visit: Payer: Self-pay | Admitting: Nurse Practitioner

## 2019-07-28 ENCOUNTER — Other Ambulatory Visit: Payer: Self-pay

## 2019-07-28 ENCOUNTER — Telehealth: Payer: Self-pay

## 2019-07-28 DIAGNOSIS — E039 Hypothyroidism, unspecified: Secondary | ICD-10-CM

## 2019-07-28 MED ORDER — LEVOTHYROXINE SODIUM 50 MCG PO TABS: 50.0000 ug | ORAL_TABLET | Freq: Every day | ORAL | 0 refills | Status: DC

## 2019-07-28 NOTE — Telephone Encounter (Signed)
Denied xanax refill request pt need appt for refills

## 2019-08-18 ENCOUNTER — Other Ambulatory Visit: Payer: Self-pay

## 2019-08-18 DIAGNOSIS — E039 Hypothyroidism, unspecified: Secondary | ICD-10-CM

## 2019-08-18 MED ORDER — LEVOTHYROXINE SODIUM 50 MCG PO TABS: 50.0000 ug | ORAL_TABLET | Freq: Every day | ORAL | 0 refills | Status: DC

## 2019-08-18 NOTE — Telephone Encounter (Signed)
SPOKE WITH SHE DON'T LIKE TO COME IN SHE WANT VIRTUAL THIS TIME AND ADVISED SHE NEED IN PERSON NEXT APPT FOR REFILLS

## 2019-08-19 ENCOUNTER — Encounter: Payer: Self-pay | Admitting: Nurse Practitioner

## 2019-08-19 ENCOUNTER — Ambulatory Visit: Payer: 59 | Admitting: Nurse Practitioner

## 2019-08-19 ENCOUNTER — Other Ambulatory Visit: Payer: Self-pay

## 2019-08-19 VITALS — BP 120/75 | HR 74 | Ht 65.0 in | Wt 184.0 lb

## 2019-08-19 DIAGNOSIS — E039 Hypothyroidism, unspecified: Secondary | ICD-10-CM | POA: Diagnosis not present

## 2019-08-19 DIAGNOSIS — F411 Generalized anxiety disorder: Secondary | ICD-10-CM | POA: Diagnosis not present

## 2019-08-19 DIAGNOSIS — I10 Essential (primary) hypertension: Secondary | ICD-10-CM | POA: Diagnosis not present

## 2019-08-19 MED ORDER — ALPRAZOLAM 0.25 MG PO TABS: 0.2500 mg | ORAL_TABLET | Freq: Two times a day (BID) | ORAL | 3 refills | Status: DC | PRN

## 2019-08-19 NOTE — Progress Notes (Signed)
[facility] [place], [place] [address]  Internal MEDICINE  Telephone Visit  Patient Name: Grace Christensen  [id number]  [id number]  Date of Service: 08/28/2019  I connected with the patient at 4:56pm by webcam and verified the patients identity using two identifiers.   I discussed the limitations, risks, security and privacy concerns of performing an evaluation and management service by webcam and the availability of in person appointments. I also discussed with the patient that there may be a patient responsible charge related to the service.  The patient expressed understanding and agrees to proceed.    Chief Complaint  Patient presents with  . Telephone Assessment  . Telephone Screen  . Medication Refill    xanax    The patient has been contacted via webcam for follow up visit due to concerns for spread of novel coronavirus. Doing well. No physical concerns or complaints. She does need to have refills of routine medications. Is treated for anxiety/depression and she is doing well on current medications.      Current Medication: Outpatient Encounter Medications as of 08/19/2019  Medication Sig  . ALPRAZolam (XANAX) 0.25 MG tablet Take 1 tablet (0.25 mg total) by mouth 2 (two) times daily as needed for anxiety.  Marland Kitchen aspirin EC 81 MG tablet Take 81 mg by mouth daily.  . B Complex Vitamins (B COMPLEX PO) Place 1 mL under the tongue daily.  . Biotin 1000 MCG tablet Take 1,000 mcg by mouth daily.  . bisoprolol (ZEBETA) 10 MG tablet Take 1 tablet (10 mg total) by mouth at bedtime.  Marland Kitchen buPROPion (WELLBUTRIN XL) 300 MG 24 hr tablet Take 1 tablet (300 mg total) by mouth every morning.  . Calcium Carb-Cholecalciferol (CALCIUM 600+D3) 600-800 MG-UNIT TABS Take 1 tablet by mouth daily.  . Cholecalciferol (VITAMIN D) 2000 units tablet Take 2,000 Units by mouth daily.  . fexofenadine (ALLEGRA) 180 MG tablet Take 180 mg by mouth every morning. Allergy Relief   .  levothyroxine (SYNTHROID) 50 MCG tablet Take 1 tablet (50 mcg total) by mouth at bedtime.  . Omega-3 Fatty Acids (OMEGA-3 FISH OIL) 1200 MG CAPS Take 1-2 capsules by mouth See admin instructions. 1 capsule in the morning, and 2 capsules in the evening  . Potassium 99 MG TABS Take 1 tablet by mouth daily.  . sertraline (ZOLOFT) 100 MG tablet Take 2 tablets (200 mg total) by mouth every morning.  . traZODone (DESYREL) 100 MG tablet Take 3 tablets po QHS prn  . vitamin B-12 (CYANOCOBALAMIN) 1000 MCG tablet Take 1,000 mcg by mouth daily.  . [DISCONTINUED] ALPRAZolam (XANAX) 0.25 MG tablet Take 1 tablet (0.25 mg total) by mouth 2 (two) times daily as needed for anxiety.  . [DISCONTINUED] HYDROcodone-acetaminophen (NORCO) 5-325 MG tablet Take 1 tablet by mouth every 6 (six) hours as needed for up to 7 doses for severe pain.  . [DISCONTINUED] meloxicam (MOBIC) 7.5 MG tablet Take 1 tablet (7.5 mg total) by mouth daily.  . [DISCONTINUED] amoxicillin-clavulanate (AUGMENTIN) 875-125 MG tablet Take 1 tablet by mouth 2 (two) times daily.   No facility-administered encounter medications on file as of 08/19/2019.     Surgical History: Past Surgical History:  Procedure Laterality Date  . COLONOSCOPY    . FUNCTIONAL ENDOSCOPIC SINUS SURGERY    . KNEE ARTHROSCOPY Right 03/04/2017   Procedure: ARTHROSCOPY KNEE, REMOVAL OF LOOSE BODY. CHONDROPLASTY MEDIAL COMPARTMENT;  Surgeon: Dereck Leep, MD;  Location: [facility] ORS;  Service: Orthopedics;  Laterality: Right;  .  NOVASURE ABLATION    . TUBAL LIGATION      Medical History: Past Medical History:  Diagnosis Date  . Anemia    H/O YEARS AGO  . Cancer ([place])    SKIN CANCER ON FACE  . GERD (gastroesophageal reflux disease)   . Hypertension   . Hypothyroidism     Family History: Family History  Problem Relation Age of Onset  . Cancer Mother   . Hypertension Mother   . Prostate cancer Father     Social History   Socioeconomic History  . Marital  status: Married    Spouse name: Not on file  . Number of children: Not on file  . Years of education: Not on file  . Highest education level: Not on file  Occupational History  . Not on file  Social Needs  . Financial resource strain: Not on file  . Food insecurity    Worry: Not on file    Inability: Not on file  . Transportation needs    Medical: Not on file    Non-medical: Not on file  Tobacco Use  . Smoking status: Former Smoker    Packs/day: 1.00    Years: 15.00    Pack years: 15.00    Types: Cigarettes    Quit date: 07/27/2009    Years since quitting: 10.0  . Smokeless tobacco: Never Used  Substance and Sexual Activity  . Alcohol use: Yes    Comment: [place] [employer]   . Drug use: No  . Sexual activity: Not on file  Lifestyle  . Physical activity    Days per week: Not on file    Minutes per session: Not on file  . Stress: Not on file  Relationships  . Social Herbalist on phone: Not on file    Gets together: Not on file    Attends religious service: Not on file    Active member of club or organization: Not on file    Attends meetings of clubs or organizations: Not on file    Relationship status: Not on file  . Intimate partner violence    Fear of current or ex partner: Not on file    Emotionally abused: Not on file    Physically abused: Not on file    Forced sexual activity: Not on file  Other Topics Concern  . Not on file  Social History Narrative  . Not on file      Review of Systems  Constitutional: Negative for chills, fatigue and unexpected weight change.  HENT: Negative for congestion, rhinorrhea, sneezing and sore throat.   Respiratory: Negative for cough, chest tightness and shortness of breath.   Cardiovascular: Negative for chest pain and palpitations.  Gastrointestinal: Negative for abdominal pain, constipation, diarrhea, nausea and vomiting.  Endocrine: Negative for cold intolerance, heat intolerance, polydipsia and polyuria.   Musculoskeletal: Negative for arthralgias, back pain, joint swelling and neck pain.  Skin: Negative for rash.  Allergic/Immunologic: Negative for environmental allergies.  Neurological: Negative for dizziness, tremors, numbness and headaches.  Hematological: Negative for adenopathy. Does not bruise/bleed easily.  Psychiatric/Behavioral: Positive for dysphoric mood. Negative for behavioral problems and sleep disturbance. The patient is nervous/anxious.     Today's Vitals   08/19/19 1549  BP: 120/75  Pulse: 74  Weight: 184 lb (83.5 kg)  Height: 5\' 5"  (1.651 m)   Body mass index is 30.62 kg/m.  Observation/Objective:   The patient is alert and oriented. She is pleasant and answers  all questions appropriately. Breathing is non-labored. She is in no acute distress at this time.    Assessment/Plan: 1. Hypertension, unspecified type Stable. Continue blood pressure medication as prescribed  2. Acquired hypothyroidism Continue levothyroxine as prescribed.   3. Generalized anxiety disorder conitnue serrtraline and bupropion daily. May take alprazolam 0.25mg  twice daily as needed for acute anxiety. New prescription sent to her pharmacy.  - ALPRAZolam (XANAX) 0.25 MG tablet; Take 1 tablet (0.25 mg total) by mouth 2 (two) times daily as needed for anxiety.  Dispense: 60 tablet; Refill: 3  General Counseling: Hanadi verbalizes understanding of the findings of today's phone visit and agrees with plan of treatment. I have discussed any further diagnostic evaluation that may be needed or ordered today. We also reviewed her medications today. she has been encouraged to call the office with any questions or concerns that should arise related to todays visit.  This patient was seen by [place] with Dr Lavera Guise as a part of collaborative care agreement  Meds ordered this encounter  Medications  . ALPRAZolam (XANAX) 0.25 MG tablet    Sig: Take 1 tablet (0.25 mg  total) by mouth 2 (two) times daily as needed for anxiety.    Dispense:  60 tablet    Refill:  3    Order Specific Question:   Supervising Provider    Answer:   Lavera Guise [id number]    Time spent: 64 Minutes    Dr Lavera Guise Internal medicine

## 2019-10-06 ENCOUNTER — Other Ambulatory Visit: Payer: Self-pay

## 2019-10-06 DIAGNOSIS — E039 Hypothyroidism, unspecified: Secondary | ICD-10-CM

## 2019-10-06 MED ORDER — LEVOTHYROXINE SODIUM 50 MCG PO TABS: 50.0000 ug | ORAL_TABLET | Freq: Every day | ORAL | 1 refills | Status: DC

## 2019-11-30 ENCOUNTER — Encounter: Payer: Self-pay | Admitting: Adult Health

## 2019-11-30 ENCOUNTER — Other Ambulatory Visit: Payer: Self-pay

## 2019-11-30 ENCOUNTER — Ambulatory Visit: Payer: 59 | Admitting: Adult Health

## 2019-11-30 VITALS — BP 149/94 | HR 70 | Resp 16 | Ht 65.0 in | Wt 185.0 lb

## 2019-11-30 DIAGNOSIS — I10 Essential (primary) hypertension: Secondary | ICD-10-CM

## 2019-11-30 DIAGNOSIS — F411 Generalized anxiety disorder: Secondary | ICD-10-CM

## 2019-11-30 DIAGNOSIS — R21 Rash and other nonspecific skin eruption: Secondary | ICD-10-CM

## 2019-11-30 DIAGNOSIS — S0990XA Unspecified injury of head, initial encounter: Secondary | ICD-10-CM

## 2019-11-30 NOTE — Progress Notes (Signed)
[facility] [place], [place] [address]  Internal MEDICINE  Office Visit Note  Patient Name: Grace Christensen  [id number]  [id number]  Date of Service: 12/01/2019  Chief Complaint  Patient presents with  . Rash    mainly on legs, on top of feet, inner thighs, on left arm, little on stomach, small red spots, itch sometimes; has been about 3 weeks   . Mass    pt fell in october still has bump on head  . Fatigue     HPI Pt is here for a sick visit. She reports a few weeks ago she noticed red spots on her legs.  They do not itch, and are flat.  They have over time spread up her legs, on to her stomach and now arms.  Very minimal itching, they remain flat,  The color changes from light red to dark red, per patient.  She works for [employer], but denies working with chemicals or exposure.  She reports her mom had an unknown lymph node cancer.    She reports about 3 months ago she fell getting out of her car onto the ground.  She hit her head and has had a small area of swelling at the site ever since.  She did not lose consciousness.  She did have a bruise for awhile.  It has gotten better, but is still faintly present.     Current Medication:  Outpatient Encounter Medications as of 11/30/2019  Medication Sig  . ALPRAZolam (XANAX) 0.25 MG tablet Take 1 tablet (0.25 mg total) by mouth 2 (two) times daily as needed for anxiety.  Marland Kitchen aspirin EC 81 MG tablet Take 81 mg by mouth daily.  . B Complex Vitamins (B COMPLEX PO) Place 1 mL under the tongue daily.  . Biotin 1000 MCG tablet Take 1,000 mcg by mouth daily.  . bisoprolol (ZEBETA) 10 MG tablet Take 1 tablet (10 mg total) by mouth at bedtime.  Marland Kitchen buPROPion (WELLBUTRIN XL) 300 MG 24 hr tablet Take 1 tablet (300 mg total) by mouth every morning.  . Calcium Carb-Cholecalciferol (CALCIUM 600+D3) 600-800 MG-UNIT TABS Take 1 tablet by mouth daily.  . Cholecalciferol (VITAMIN D) 2000 units tablet Take 2,000 Units by mouth  daily.  . fexofenadine (ALLEGRA) 180 MG tablet Take 180 mg by mouth every morning. Allergy Relief   . levothyroxine (SYNTHROID) 50 MCG tablet Take 1 tablet (50 mcg total) by mouth at bedtime.  . Omega-3 Fatty Acids (OMEGA-3 FISH OIL) 1200 MG CAPS Take 1-2 capsules by mouth See admin instructions. 1 capsule in the morning, and 2 capsules in the evening  . Potassium 99 MG TABS Take 1 tablet by mouth daily.  . sertraline (ZOLOFT) 100 MG tablet Take 2 tablets (200 mg total) by mouth every morning.  . traZODone (DESYREL) 100 MG tablet Take 3 tablets po QHS prn  . vitamin B-12 (CYANOCOBALAMIN) 1000 MCG tablet Take 1,000 mcg by mouth daily.   No facility-administered encounter medications on file as of 11/30/2019.      Medical History: Past Medical History:  Diagnosis Date  . Anemia    H/O YEARS AGO  . Cancer ([place])    SKIN CANCER ON FACE  . GERD (gastroesophageal reflux disease)   . Hypertension   . Hypothyroidism      Vital Signs: BP (!) 149/94   Pulse 70   Resp 16   Ht _0  (1.651 m)   Wt 185 lb (83.9 kg)  SpO2 99%   BMI 30.79 kg/m    Review of Systems  Constitutional: Negative for chills, fatigue and unexpected weight change.  HENT: Negative for congestion, rhinorrhea, sneezing and sore throat.   Eyes: Negative for photophobia, pain and redness.  Respiratory: Negative for cough, chest tightness and shortness of breath.   Cardiovascular: Negative for chest pain and palpitations.  Gastrointestinal: Negative for abdominal pain, constipation, diarrhea, nausea and vomiting.  Endocrine: Negative.   Genitourinary: Negative for dysuria and frequency.  Musculoskeletal: Negative for arthralgias, back pain, joint swelling and neck pain.  Skin: Negative for rash.  Allergic/Immunologic: Negative.   Neurological: Negative for tremors and numbness.  Hematological: Negative for adenopathy. Does not bruise/bleed easily.  Psychiatric/Behavioral: Negative for behavioral problems and  sleep disturbance. The patient is not nervous/anxious.     Physical Exam Vitals and nursing note reviewed.  Constitutional:      General: She is not in acute distress.    Appearance: She is well-developed. She is not diaphoretic.  HENT:     Head: Normocephalic and atraumatic.     Mouth/Throat:     Pharynx: No oropharyngeal exudate.  Eyes:     Pupils: Pupils are equal, round, and reactive to light.  Neck:     Thyroid: No thyromegaly.     Vascular: No JVD.     Trachea: No tracheal deviation.  Cardiovascular:     Rate and Rhythm: Normal rate and regular rhythm.     Heart sounds: Normal heart sounds. No murmur. No friction rub. No gallop.   Pulmonary:     Effort: Pulmonary effort is normal. No respiratory distress.     Breath sounds: Normal breath sounds. No wheezing or rales.  Chest:     Chest wall: No tenderness.  Abdominal:     Palpations: Abdomen is soft.     Tenderness: There is no abdominal tenderness. There is no guarding.  Musculoskeletal:        General: Normal range of motion.     Cervical back: Normal range of motion and neck supple.  Lymphadenopathy:     Cervical: No cervical adenopathy.  Skin:    General: Skin is warm and dry.     Findings: Rash present.     Comments: Flat irregularly shaped red spots. Scattered about legs trunk and arms.   Neurological:     Mental Status: She is alert and oriented to person, place, and time.     Cranial Nerves: No cranial nerve deficit.  Psychiatric:        Behavior: Behavior normal.        Thought Content: Thought content normal.        Judgment: Judgment normal.    Assessment/Plan: 1. Rash Nonspecific skin eruption Pt to have labs drawn stat. - HIV antibody (with reflex) - PT and PTT - Comprehensive Metabolic Panel (CMET) - CBC w/Diff/Platelet - Hepatic function panel - Hep B Core Ab W/Reflex - Hepatitis c antibody (reflex) - Sed Rate (ESR) - ANA  2. Hypertension, unspecified type Slightly elevated, pt is  anxious at this time. Will monitor   3. Generalized anxiety disorder Stable, continue present management.   4. Traumatic injury of head, initial encounter Pt does have slight swelling to forehead, no bruising, no fluid collection under the skin. Continue to monitor.   General Counseling: vear staton understanding of the findings of todays visit and agrees with plan of treatment. I have discussed any further diagnostic evaluation that may be needed or ordered today. We  also reviewed her medications today. she has been encouraged to call the office with any questions or concerns that should arise related to todays visit.   Orders Placed This Encounter  Procedures  . HIV antibody (with reflex)  . PT and PTT  . Comprehensive Metabolic Panel (CMET)  . CBC w/Diff/Platelet  . Hepatic function panel  . Hep B Core Ab W/Reflex  . Hepatitis c antibody (reflex)  . Sed Rate (ESR)  . ANA  . HCV Comment:  . Ambulatory referral to Dermatology    No orders of the defined types were placed in this encounter.   Time spent: 25 Minutes  This patient was seen by Orson Gear AGNP-C in Collaboration with Dr Lavera Guise as a part of collaborative care agreement.  Kendell Bane AGNP-C Internal Medicine

## 2019-12-05 LAB — COMPREHENSIVE METABOLIC PANEL
ALT: 21 IU/L (ref 0–32)
AST: 17 IU/L (ref 0–40)
Albumin/Globulin Ratio: 1.4 (ref 1.2–2.2)
Albumin: 4.2 g/dL (ref 3.8–4.9)
Alkaline Phosphatase: 108 IU/L (ref 39–117)
BUN/Creatinine Ratio: 15 (ref 9–23)
BUN: 12 mg/dL (ref 6–24)
Bilirubin Total: 0.7 mg/dL (ref 0.0–1.2)
CO2: 25 mmol/L (ref 20–29)
Calcium: 9.4 mg/dL (ref 8.7–10.2)
Chloride: 97 mmol/L (ref 96–106)
Creatinine, Ser: 0.79 mg/dL (ref 0.57–1.00)
GFR calc Af Amer: 95 mL/min/{1.73_m2} (ref >59)
GFR calc non Af Amer: 82 mL/min/{1.73_m2} (ref >59)
Globulin, Total: 2.9 g/dL (ref 1.5–4.5)
Glucose: 115 mg/dL — ABNORMAL HIGH (ref 65–99)
Potassium: 4.1 mmol/L (ref 3.5–5.2)
Sodium: 137 mmol/L (ref 134–144)
Total Protein: 7.1 g/dL (ref 6.0–8.5)

## 2019-12-05 LAB — CBC WITH DIFFERENTIAL/PLATELET
Basophils Absolute: 0 10*3/uL (ref 0.0–0.2)
Basos: 0 %
EOS (ABSOLUTE): 0.1 10*3/uL (ref 0.0–0.4)
Eos: 1 %
Hematocrit: 41.4 % (ref 34.0–46.6)
Hemoglobin: 14.5 g/dL (ref 11.1–15.9)
Immature Grans (Abs): 0 10*3/uL (ref 0.0–0.1)
Immature Granulocytes: 0 %
Lymphocytes Absolute: 2.1 10*3/uL (ref 0.7–3.1)
Lymphs: 29 %
MCH: 31.3 pg (ref 26.6–33.0)
MCHC: 35 g/dL (ref 31.5–35.7)
MCV: 89 fL (ref 79–97)
Monocytes Absolute: 0.6 10*3/uL (ref 0.1–0.9)
Monocytes: 8 %
Neutrophils Absolute: 4.5 10*3/uL (ref 1.4–7.0)
Neutrophils: 62 %
Platelets: 165 10*3/uL (ref 150–450)
RBC: 4.64 x10E6/uL (ref 3.77–5.28)
RDW: 12.3 % (ref 11.7–15.4)
WBC: 7.3 10*3/uL (ref 3.4–10.8)

## 2019-12-05 LAB — HEPATIC FUNCTION PANEL: Bilirubin, Direct: 0.18 mg/dL (ref 0.00–0.40)

## 2019-12-05 LAB — HEPATITIS B CORE AB W/REFLEX: Hep B Core Total Ab: NEGATIVE

## 2019-12-05 LAB — HCV COMMENT:

## 2019-12-05 LAB — ANA: ANA Titer 1: NEGATIVE

## 2019-12-05 LAB — SEDIMENTATION RATE: Sed Rate: 12 mm/hr (ref 0–40)

## 2019-12-05 LAB — HEPATITIS C ANTIBODY (REFLEX): HCV Ab: <0.1 s/co ratio (ref 0.0–0.9)

## 2019-12-20 ENCOUNTER — Telehealth: Payer: Self-pay

## 2019-12-20 NOTE — Telephone Encounter (Signed)
Confirmed appointment with patient and screened for covid. klh 

## 2019-12-22 ENCOUNTER — Ambulatory Visit (INDEPENDENT_AMBULATORY_CARE_PROVIDER_SITE_OTHER): Payer: 59 | Admitting: Adult Health

## 2019-12-22 ENCOUNTER — Other Ambulatory Visit: Payer: Self-pay

## 2019-12-22 ENCOUNTER — Encounter: Payer: Self-pay | Admitting: Adult Health

## 2019-12-22 VITALS — BP 138/94 | HR 63 | Resp 16 | Ht 65.0 in | Wt 185.0 lb

## 2019-12-22 DIAGNOSIS — Z0001 Encounter for general adult medical examination with abnormal findings: Secondary | ICD-10-CM

## 2019-12-22 DIAGNOSIS — Z6830 Body mass index (BMI) 30.0-30.9, adult: Secondary | ICD-10-CM

## 2019-12-22 DIAGNOSIS — R300 Dysuria: Secondary | ICD-10-CM

## 2019-12-22 DIAGNOSIS — Z124 Encounter for screening for malignant neoplasm of cervix: Secondary | ICD-10-CM | POA: Diagnosis not present

## 2019-12-22 DIAGNOSIS — M170 Bilateral primary osteoarthritis of knee: Secondary | ICD-10-CM

## 2019-12-22 DIAGNOSIS — F5101 Primary insomnia: Secondary | ICD-10-CM

## 2019-12-22 DIAGNOSIS — E039 Hypothyroidism, unspecified: Secondary | ICD-10-CM | POA: Diagnosis not present

## 2019-12-22 DIAGNOSIS — F411 Generalized anxiety disorder: Secondary | ICD-10-CM | POA: Diagnosis not present

## 2019-12-22 DIAGNOSIS — Z112 Encounter for screening for other bacterial diseases: Secondary | ICD-10-CM

## 2019-12-22 DIAGNOSIS — I10 Essential (primary) hypertension: Secondary | ICD-10-CM

## 2019-12-22 MED ORDER — ALPRAZOLAM 0.25 MG PO TABS: 0.2500 mg | ORAL_TABLET | Freq: Two times a day (BID) | ORAL | 2 refills | Status: DC | PRN

## 2019-12-22 MED ORDER — SERTRALINE HCL 100 MG PO TABS: 200.0000 mg | ORAL_TABLET | ORAL | 3 refills | Status: DC

## 2019-12-22 MED ORDER — BISOPROLOL FUMARATE 10 MG PO TABS: 10.0000 mg | ORAL_TABLET | Freq: Every day | ORAL | 3 refills | Status: DC

## 2019-12-22 MED ORDER — LEVOTHYROXINE SODIUM 50 MCG PO TABS: 50.0000 ug | ORAL_TABLET | Freq: Every day | ORAL | 1 refills | Status: DC

## 2019-12-22 MED ORDER — BUPROPION HCL ER (XL) 300 MG PO TB24: 300.0000 mg | ORAL_TABLET | ORAL | 3 refills | Status: DC

## 2019-12-22 NOTE — Progress Notes (Signed)
[facility] [place],  [address]  Internal MEDICINE  Office Visit Note  Patient Name: Grace Christensen  [id number]  [id number]  Date of Service: 12/22/2019  Chief Complaint  Patient presents with  . Annual Exam  . Gynecologic Exam  . Hypertension  . Gastroesophageal Reflux  . Medication Refill    xanax ([facility]), zoloft, wellbutrin, bisoprolol , levothyorxine     HPI Pt is here for routine health maintenance examination.  She is a well appearing 59 yo female.  She has a history of HTN, GERD, Anxiety and hypothyroid. Overall she is doing well.  She denies any current issues.  At our last visit she had a rash and has seen dermatology. Biopsy results are pending at this time. She reports her GERD and HTN have been stable.  Denies Chest pain, Shortness of breath, palpitations, headache, or blurred vision.   Current Medication: Outpatient Encounter Medications as of 12/22/2019  Medication Sig  . ALPRAZolam (XANAX) 0.25 MG tablet Take 1 tablet (0.25 mg total) by mouth 2 (two) times daily as needed for anxiety.  Marland Kitchen aspirin EC 81 MG tablet Take 81 mg by mouth daily.  . B Complex Vitamins (B COMPLEX PO) Place 1 mL under the tongue daily.  . Biotin 1000 MCG tablet Take 1,000 mcg by mouth daily.  . bisoprolol (ZEBETA) 10 MG tablet Take 1 tablet (10 mg total) by mouth at bedtime.  Marland Kitchen buPROPion (WELLBUTRIN XL) 300 MG 24 hr tablet Take 1 tablet (300 mg total) by mouth every morning.  . Calcium Carb-Cholecalciferol (CALCIUM 600+D3) 600-800 MG-UNIT TABS Take 1 tablet by mouth daily.  . Cholecalciferol (VITAMIN D) 2000 units tablet Take 2,000 Units by mouth daily.  . fexofenadine (ALLEGRA) 180 MG tablet Take 180 mg by mouth every morning. Allergy Relief   . levothyroxine (SYNTHROID) 50 MCG tablet Take 1 tablet (50 mcg total) by mouth at bedtime.  . Omega-3 Fatty Acids (OMEGA-3 FISH OIL) 1200 MG CAPS Take 1-2 capsules by mouth See admin instructions. 1 capsule in the  morning, and 2 capsules in the evening  . Potassium 99 MG TABS Take 1 tablet by mouth daily.  . sertraline (ZOLOFT) 100 MG tablet Take 2 tablets (200 mg total) by mouth every morning.  . traZODone (DESYREL) 100 MG tablet Take 3 tablets po QHS prn  . vitamin B-12 (CYANOCOBALAMIN) 1000 MCG tablet Take 1,000 mcg by mouth daily.  . [DISCONTINUED] ALPRAZolam (XANAX) 0.25 MG tablet Take 1 tablet (0.25 mg total) by mouth 2 (two) times daily as needed for anxiety.  . [DISCONTINUED] bisoprolol (ZEBETA) 10 MG tablet Take 1 tablet (10 mg total) by mouth at bedtime.  . [DISCONTINUED] buPROPion (WELLBUTRIN XL) 300 MG 24 hr tablet Take 1 tablet (300 mg total) by mouth every morning.  . [DISCONTINUED] levothyroxine (SYNTHROID) 50 MCG tablet Take 1 tablet (50 mcg total) by mouth at bedtime.  . [DISCONTINUED] sertraline (ZOLOFT) 100 MG tablet Take 2 tablets (200 mg total) by mouth every morning.   No facility-administered encounter medications on file as of 12/22/2019.    Surgical History: Past Surgical History:  Procedure Laterality Date  . COLONOSCOPY    . FUNCTIONAL ENDOSCOPIC SINUS SURGERY    . KNEE ARTHROSCOPY Right 03/04/2017   Procedure: ARTHROSCOPY KNEE, REMOVAL OF LOOSE BODY. CHONDROPLASTY MEDIAL COMPARTMENT;  Surgeon: Dereck Leep, MD;  Location: [facility] ORS;  Service: Orthopedics;  Laterality: Right;  . NOVASURE ABLATION    . TUBAL LIGATION  Medical History: Past Medical History:  Diagnosis Date  . Anemia    H/O YEARS AGO  . Cancer ([place])    SKIN CANCER ON FACE  . GERD (gastroesophageal reflux disease)   . Hypertension   . Hypothyroidism     Family History: Family History  Problem Relation Age of Onset  . Cancer Mother   . Hypertension Mother   . Prostate cancer Father       Review of Systems  Constitutional: Negative for chills, fatigue and unexpected weight change.  HENT: Negative for congestion, rhinorrhea, sneezing and sore throat.   Eyes: Negative for photophobia, pain  and redness.  Respiratory: Negative for cough, chest tightness and shortness of breath.   Cardiovascular: Negative for chest pain and palpitations.  Gastrointestinal: Negative for abdominal pain, constipation, diarrhea, nausea and vomiting.  Endocrine: Negative.   Genitourinary: Negative for dysuria and frequency.  Musculoskeletal: Negative for arthralgias, back pain, joint swelling and neck pain.  Skin: Negative for rash.  Allergic/Immunologic: Negative.   Neurological: Negative for tremors and numbness.  Hematological: Negative for adenopathy. Does not bruise/bleed easily.  Psychiatric/Behavioral: Negative for behavioral problems and sleep disturbance. The patient is not nervous/anxious.      Vital Signs: BP (!) 169/98   Pulse 63   Resp 16   Ht '5\' 5"'$  (1.651 m)   Wt 185 lb (83.9 kg)   SpO2 97%   BMI 30.79 kg/m    Physical Exam Vitals and nursing note reviewed.  Constitutional:      General: She is not in acute distress.    Appearance: She is well-developed. She is not diaphoretic.  HENT:     Head: Normocephalic and atraumatic.     Mouth/Throat:     Pharynx: No oropharyngeal exudate.  Eyes:     Pupils: Pupils are equal, round, and reactive to light.  Neck:     Thyroid: No thyromegaly.     Vascular: No JVD.     Trachea: No tracheal deviation.  Cardiovascular:     Rate and Rhythm: Normal rate and regular rhythm.     Heart sounds: Normal heart sounds. No murmur. No friction rub. No gallop.   Pulmonary:     Effort: Pulmonary effort is normal. No respiratory distress.     Breath sounds: Normal breath sounds. No wheezing or rales.  Chest:     Chest wall: No tenderness.     Breasts:        Right: Normal.        Left: Normal.     Comments: Exam Chaperoned by Corlis Hove CMA Abdominal:     Palpations: Abdomen is soft.     Tenderness: There is no abdominal tenderness. There is no guarding.  Musculoskeletal:        General: Normal range of motion.     Cervical back:  Normal range of motion and neck supple.  Lymphadenopathy:     Cervical: No cervical adenopathy.  Skin:    General: Skin is warm and dry.  Neurological:     Mental Status: She is alert and oriented to person, place, and time.     Cranial Nerves: No cranial nerve deficit.  Psychiatric:        Behavior: Behavior normal.        Thought Content: Thought content normal.        Judgment: Judgment normal.      LABS: Recent Results (from the past 2160 hour(s))  Comprehensive Metabolic Panel (CMET)     Status:  Abnormal   Collection Time: 11/30/19  1:03 PM  Result Value Ref Range   Glucose 115 (H) 65 - 99 mg/dL   BUN 12 6 - 24 mg/dL   Creatinine, Ser 0.79 0.57 - 1.00 mg/dL   GFR calc non Af Amer 82 >59 mL/min/1.73   GFR calc Af Amer 95 >59 mL/min/1.73   BUN/Creatinine Ratio 15 9 - 23   Sodium 137 134 - 144 mmol/L   Potassium 4.1 3.5 - 5.2 mmol/L   Chloride 97 96 - 106 mmol/L   CO2 25 20 - 29 mmol/L   Calcium 9.4 8.7 - 10.2 mg/dL   Total Protein 7.1 6.0 - 8.5 g/dL   Albumin 4.2 3.8 - 4.9 g/dL   Globulin, Total 2.9 1.5 - 4.5 g/dL   Albumin/Globulin Ratio 1.4 1.2 - 2.2   Bilirubin Total 0.7 0.0 - 1.2 mg/dL   Alkaline Phosphatase 108 39 - 117 IU/L   AST 17 0 - 40 IU/L   ALT 21 0 - 32 IU/L  CBC w/Diff/Platelet     Status: None   Collection Time: 11/30/19  1:03 PM  Result Value Ref Range   WBC 7.3 3.4 - 10.8 x10E3/uL   RBC 4.64 3.77 - 5.28 x10E6/uL   Hemoglobin 14.5 11.1 - 15.9 g/dL   Hematocrit 41.4 34.0 - 46.6 %   MCV 89 79 - 97 fL   MCH 31.3 26.6 - 33.0 pg   MCHC 35.0 31.5 - 35.7 g/dL   RDW 12.3 11.7 - 15.4 %   Platelets 165 150 - 450 x10E3/uL   Neutrophils 62 Not Estab. %   Lymphs 29 Not Estab. %   Monocytes 8 Not Estab. %   Eos 1 Not Estab. %   Basos 0 Not Estab. %   Neutrophils Absolute 4.5 1.4 - 7.0 x10E3/uL   Lymphocytes Absolute 2.1 0.7 - 3.1 x10E3/uL   Monocytes Absolute 0.6 0.1 - 0.9 x10E3/uL   EOS (ABSOLUTE) 0.1 0.0 - 0.4 x10E3/uL   Basophils Absolute 0.0 0.0 -  0.2 x10E3/uL   Immature Granulocytes 0 Not Estab. %   Immature Grans (Abs) 0.0 0.0 - 0.1 x10E3/uL  Hepatic function panel     Status: None   Collection Time: 11/30/19  1:03 PM  Result Value Ref Range   Bilirubin, Direct 0.18 0.00 - 0.40 mg/dL  Hep B Core Ab W/Reflex     Status: None   Collection Time: 11/30/19  1:03 PM  Result Value Ref Range   Hep B Core Total Ab Negative Negative  Hepatitis c antibody (reflex)     Status: None   Collection Time: 11/30/19  1:03 PM  Result Value Ref Range   HCV Ab <0.1 0.0 - 0.9 s/co ratio  Sed Rate (ESR)     Status: None   Collection Time: 11/30/19  1:03 PM  Result Value Ref Range   Sed Rate 12 0 - 40 mm/hr  ANA     Status: None   Collection Time: 11/30/19  1:03 PM  Result Value Ref Range   ANA Titer 1 Negative     Comment:                                      Negative   <1:80  Borderline  1:80                                      Positive   >1:80    Please Note: Comment     Comment: ANA Multiplex methodology was designed to detect up to 11 antibodies of the 100+ antibodies that may be detected by ANA IFA methodology.   HCV Comment:     Status: None   Collection Time: 11/30/19  1:03 PM  Result Value Ref Range   Comment: Comment     Comment: Non reactive HCV antibody screen is consistent with no HCV infection, unless recent infection is suspected or other evidence exists to indicate HCV infection.      Assessment/Plan: 1. Encounter for general adult medical examination with abnormal findings Up to date on PHM will follow up on labs when available. - TSH - T4, free - Lipid Panel With LDL/HDL Ratio  2. Hypertension, unspecified type Stable, continue bisoprolol - bisoprolol (ZEBETA) 10 MG tablet; Take 1 tablet (10 mg total) by mouth at bedtime.  Dispense: 90 tablet; Refill: 3  3. Generalized anxiety disorder Refilled xanax, zoloft and Wellbutrin.   Reviewed risks and possible side effects  associated with taking opiates, benzodiazepines and other CNS depressants. Combination of these could cause dizziness and drowsiness. Advised patient not to drive or operate machinery when taking these medications, as patient's and other's life can be at risk and will have consequences. Patient verbalized understanding in this matter. Dependence and abuse for these drugs will be monitored closely. A Controlled substance policy and procedure is on file which allows [facility] to order a urine drug screen test at any visit. Patient understands and agrees with the plan - ALPRAZolam (XANAX) 0.25 MG tablet; Take 1 tablet (0.25 mg total) by mouth 2 (two) times daily as needed for anxiety.  Dispense: 60 tablet; Refill: 2 - sertraline (ZOLOFT) 100 MG tablet; Take 2 tablets (200 mg total) by mouth every morning.  Dispense: 180 tablet; Refill: 3 - buPROPion (WELLBUTRIN XL) 300 MG 24 hr tablet; Take 1 tablet (300 mg total) by mouth every morning.  Dispense: 90 tablet; Refill: 3  4. Acquired hypothyroidism Continue synthroid as directed.  - levothyroxine (SYNTHROID) 50 MCG tablet; Take 1 tablet (50 mcg total) by mouth at bedtime.  Dispense: 90 tablet; Refill: 1  5. Primary insomnia Pt continues to use trazodone, and xanax to help her sleep.   6. Primary osteoarthritis of knees, bilateral Controlled, continue present managment  7. Dysuria - UA/M w/rflx Culture, Routine  8. BMI 30.0-30.9,adult Obesity Counseling: Risk Assessment: An assessment of behavioral risk factors was made today and includes lack of exercise sedentary lifestyle, lack of portion control and poor dietary habits.  Risk Modification Advice: She was counseled on portion control guidelines. Restricting daily caloric intake to 1800. The detrimental long term effects of obesity on her health and ongoing poor compliance was also discussed with the patient.  9. Routine cervical smear - IGP, Aptima HPV  General Counseling:  Khristian verbalizes understanding of the findings of todays visit and agrees with plan of treatment. I have discussed any further diagnostic evaluation that may be needed or ordered today. We also reviewed her medications today. she has been encouraged to call the office with any questions or concerns that should arise related to todays visit.   Orders Placed This Encounter  Procedures  . UA/M  w/rflx Culture, Routine  . TSH  . T4, free  . Lipid Panel With LDL/HDL Ratio  . NuSwab Vaginitis Plus (VG+)    Meds ordered this encounter  Medications  . ALPRAZolam (XANAX) 0.25 MG tablet    Sig: Take 1 tablet (0.25 mg total) by mouth 2 (two) times daily as needed for anxiety.    Dispense:  60 tablet    Refill:  2  . sertraline (ZOLOFT) 100 MG tablet    Sig: Take 2 tablets (200 mg total) by mouth every morning.    Dispense:  180 tablet    Refill:  3  . buPROPion (WELLBUTRIN XL) 300 MG 24 hr tablet    Sig: Take 1 tablet (300 mg total) by mouth every morning.    Dispense:  90 tablet    Refill:  3  . bisoprolol (ZEBETA) 10 MG tablet    Sig: Take 1 tablet (10 mg total) by mouth at bedtime.    Dispense:  90 tablet    Refill:  3  . levothyroxine (SYNTHROID) 50 MCG tablet    Sig: Take 1 tablet (50 mcg total) by mouth at bedtime.    Dispense:  90 tablet    Refill:  1    Pt need follow up appt for further refills    Time spent: 30 Minutes   This patient was seen by Orson Gear AGNP-C in Collaboration with Dr Lavera Guise as a part of collaborative care agreement    Kendell Bane AGNP-C Internal Medicine

## 2019-12-23 LAB — UA/M W/RFLX CULTURE, ROUTINE
Bilirubin, UA: NEGATIVE
Glucose, UA: NEGATIVE
Ketones, UA: NEGATIVE
Leukocytes,UA: NEGATIVE
Nitrite, UA: NEGATIVE
Specific Gravity, UA: 1.01 (ref 1.005–1.030)
Urobilinogen, Ur: 0.2 mg/dL (ref 0.2–1.0)
pH, UA: 6.5 (ref 5.0–7.5)

## 2019-12-23 LAB — MICROSCOPIC EXAMINATION
Casts: NONE SEEN /lpf
WBC, UA: NONE SEEN /hpf (ref 0–5)

## 2019-12-26 LAB — NUSWAB VAGINITIS PLUS (VG+)
Candida albicans, NAA: NEGATIVE
Candida glabrata, NAA: NEGATIVE
Chlamydia trachomatis, NAA: NEGATIVE
Neisseria gonorrhoeae, NAA: NEGATIVE
Trich vag by NAA: NEGATIVE

## 2019-12-30 LAB — LIPID PANEL WITH LDL/HDL RATIO
Cholesterol, Total: 220 mg/dL — ABNORMAL HIGH (ref 100–199)
HDL: 56 mg/dL (ref >39)
LDL Chol Calc (NIH): 145 mg/dL — ABNORMAL HIGH (ref 0–99)
LDL/HDL Ratio: 2.6 ratio (ref 0.0–3.2)
Triglycerides: 106 mg/dL (ref 0–149)
VLDL Cholesterol Cal: 19 mg/dL (ref 5–40)

## 2019-12-30 LAB — IGP, APTIMA HPV: HPV Aptima: NEGATIVE

## 2019-12-30 LAB — T4, FREE: Free T4: 1.16 ng/dL (ref 0.82–1.77)

## 2019-12-30 LAB — TSH: TSH: 2.55 u[IU]/mL (ref 0.450–4.500)

## 2020-01-18 ENCOUNTER — Telehealth: Payer: Self-pay

## 2020-01-18 NOTE — Telephone Encounter (Signed)
Confirmed appointment on 01/23/2020 and screened for covid. klh  

## 2020-01-23 ENCOUNTER — Encounter: Payer: Self-pay | Admitting: Adult Health

## 2020-01-23 ENCOUNTER — Ambulatory Visit: Payer: 59 | Admitting: Adult Health

## 2020-01-23 VITALS — BP 124/82 | HR 77 | Temp 98.9°F | Resp 16 | Ht 65.0 in | Wt 182.0 lb

## 2020-01-23 DIAGNOSIS — E039 Hypothyroidism, unspecified: Secondary | ICD-10-CM

## 2020-01-23 DIAGNOSIS — E782 Mixed hyperlipidemia: Secondary | ICD-10-CM

## 2020-01-23 DIAGNOSIS — I10 Essential (primary) hypertension: Secondary | ICD-10-CM

## 2020-01-23 NOTE — Progress Notes (Signed)
[facility] [place], [place] [address]  Internal MEDICINE  Telephone Visit  Patient Name: Grace Christensen  [id number]  [id number]  Date of Service: 01/23/2020  I connected with the patient at 225 by telephone and verified the patients identity using two identifiers.   I discussed the limitations, risks, security and privacy concerns of performing an evaluation and management service by telephone and the availability of in person appointments. I also discussed with the patient that there may be a patient responsible charge related to the service.  The patient expressed understanding and agrees to proceed.    Chief Complaint  Patient presents with  . Telephone Screen    go over results  . Telephone Assessment    HPI  Pt seen today via telephone, she is unable to do video visit at this time.  Pt had cholesterol and thyroid levels redrawn. Overall she is doing well.  She has been dieting, and has lost 4 pounds.  She has started walking, and is hoping to lose more weight.  We discussed cholesterol and ways to improve. Denies any other complaints.    Current Medication: Outpatient Encounter Medications as of 01/23/2020  Medication Sig  . ALPRAZolam (XANAX) 0.25 MG tablet Take 1 tablet (0.25 mg total) by mouth 2 (two) times daily as needed for anxiety.  Marland Kitchen aspirin EC 81 MG tablet Take 81 mg by mouth daily.  . B Complex Vitamins (B COMPLEX PO) Place 1 mL under the tongue daily.  . Biotin 1000 MCG tablet Take 1,000 mcg by mouth daily.  . bisoprolol (ZEBETA) 10 MG tablet Take 1 tablet (10 mg total) by mouth at bedtime.  Marland Kitchen buPROPion (WELLBUTRIN XL) 300 MG 24 hr tablet Take 1 tablet (300 mg total) by mouth every morning.  . Calcium Carb-Cholecalciferol (CALCIUM 600+D3) 600-800 MG-UNIT TABS Take 1 tablet by mouth daily.  . Cholecalciferol (VITAMIN D) 2000 units tablet Take 2,000 Units by mouth daily.  . fexofenadine (ALLEGRA) 180 MG tablet Take 180 mg by mouth every  morning. Allergy Relief   . levothyroxine (SYNTHROID) 50 MCG tablet Take 1 tablet (50 mcg total) by mouth at bedtime.  . Omega-3 Fatty Acids (OMEGA-3 FISH OIL) 1200 MG CAPS Take 1-2 capsules by mouth See admin instructions. 1 capsule in the morning, and 2 capsules in the evening  . Potassium 99 MG TABS Take 1 tablet by mouth daily.  . sertraline (ZOLOFT) 100 MG tablet Take 2 tablets (200 mg total) by mouth every morning.  . traZODone (DESYREL) 100 MG tablet Take 3 tablets po QHS prn  . vitamin B-12 (CYANOCOBALAMIN) 1000 MCG tablet Take 1,000 mcg by mouth daily.   No facility-administered encounter medications on file as of 01/23/2020.    Surgical History: Past Surgical History:  Procedure Laterality Date  . COLONOSCOPY    . FUNCTIONAL ENDOSCOPIC SINUS SURGERY    . KNEE ARTHROSCOPY Right 03/04/2017   Procedure: ARTHROSCOPY KNEE, REMOVAL OF LOOSE BODY. CHONDROPLASTY MEDIAL COMPARTMENT;  Surgeon: Dereck Leep, MD;  Location: [facility] ORS;  Service: Orthopedics;  Laterality: Right;  . NOVASURE ABLATION    . TUBAL LIGATION      Medical History: Past Medical History:  Diagnosis Date  . Anemia    H/O YEARS AGO  . Cancer ([place])    SKIN CANCER ON FACE  . GERD (gastroesophageal reflux disease)   . Hypertension   . Hypothyroidism     Family History: Family History  Problem Relation Age of Onset  .  Cancer Mother   . Hypertension Mother   . Prostate cancer Father     Social History   Socioeconomic History  . Marital status: Married    Spouse name: Not on file  . Number of children: Not on file  . Years of education: Not on file  . Highest education level: Not on file  Occupational History  . Not on file  Tobacco Use  . Smoking status: Former Smoker    Packs/day: 1.00    Years: 15.00    Pack years: 15.00    Types: Cigarettes    Quit date: 07/27/2009    Years since quitting: 10.4  . Smokeless tobacco: Never Used  Substance and Sexual Activity  . Alcohol use: Yes    Comment:  [place] [employer]EEK[employer]   . Drug use: No  . Sexual activity: Not on file  Other Topics Concern  . Not on file  Social History Narrative  . Not on file   Social Determinants of Health   Financial Resource Strain:   . Difficulty of Paying Living Expenses: Not on file  Food Insecurity:   . Worried About Charity fundraiser in the Last Year: Not on file  . Ran Out of Food in the Last Year: Not on file  Transportation Needs:   . Lack of Transportation (Medical): Not on file  . Lack of Transportation (Non-Medical): Not on file  Physical Activity:   . Days of Exercise per Week: Not on file  . Minutes of Exercise per Session: Not on file  Stress:   . Feeling of Stress : Not on file  Social Connections:   . Frequency of Communication with Friends and Family: Not on file  . Frequency of Social Gatherings with Friends and Family: Not on file  . Attends Religious Services: Not on file  . Active Member of Clubs or Organizations: Not on file  . Attends Archivist Meetings: Not on file  . Marital Status: Not on file  Intimate Partner Violence:   . Fear of Current or Ex-Partner: Not on file  . Emotionally Abused: Not on file  . Physically Abused: Not on file  . Sexually Abused: Not on file      Review of Systems  Constitutional: Negative for chills, fatigue and unexpected weight change.  HENT: Negative for congestion, rhinorrhea, sneezing and sore throat.   Eyes: Negative for photophobia, pain and redness.  Respiratory: Negative for cough, chest tightness and shortness of breath.   Cardiovascular: Negative for chest pain and palpitations.  Gastrointestinal: Negative for abdominal pain, constipation, diarrhea, nausea and vomiting.  Endocrine: Negative.   Genitourinary: Negative for dysuria and frequency.  Musculoskeletal: Negative for arthralgias, back pain, joint swelling and neck pain.  Skin: Negative for rash.  Allergic/Immunologic: Negative.   Neurological: Negative for tremors  and numbness.  Hematological: Negative for adenopathy. Does not bruise/bleed easily.  Psychiatric/Behavioral: Negative for behavioral problems and sleep disturbance. The patient is not nervous/anxious.     Vital Signs: BP 124/82   Pulse 77   Temp 98.9 F (37.2 C)   Resp 16   Ht 5\' 5"  (1.651 m)   Wt 182 lb (82.6 kg)   BMI 30.29 kg/m    Observation/Objective:  Well sounding, NAD noted.    Assessment/Plan: 1. Mixed hyperlipidemia Discussed medication compliance and dietary modifications.     2. Acquired hypothyroidism Stable, continue to follow.  3. Hypertension, unspecified type Controlled, continue present therapy.  General Counseling: Grace Christensen understanding of  the findings of today's phone visit and agrees with plan of treatment. I have discussed any further diagnostic evaluation that may be needed or ordered today. We also reviewed her medications today. she has been encouraged to call the office with any questions or concerns that should arise related to todays visit.    No orders of the defined types were placed in this encounter.   No orders of the defined types were placed in this encounter.   Time spent: [facility] Internal medicine

## 2020-04-10 ENCOUNTER — Other Ambulatory Visit: Payer: Self-pay

## 2020-04-10 DIAGNOSIS — F5101 Primary insomnia: Secondary | ICD-10-CM

## 2020-04-10 MED ORDER — TRAZODONE HCL 100 MG PO TABS: ORAL_TABLET | ORAL | 0 refills | Status: DC

## 2020-05-28 ENCOUNTER — Other Ambulatory Visit: Payer: Self-pay | Admitting: Adult Health

## 2020-05-28 DIAGNOSIS — F5101 Primary insomnia: Secondary | ICD-10-CM

## 2020-05-29 NOTE — Telephone Encounter (Signed)
Pt need follow up appt for refills

## 2020-07-05 ENCOUNTER — Telehealth: Payer: Self-pay

## 2020-07-05 NOTE — Telephone Encounter (Signed)
Confirmed and screened for office visit 8/9 

## 2020-07-09 ENCOUNTER — Ambulatory Visit: Payer: 59 | Admitting: Nurse Practitioner

## 2020-08-02 ENCOUNTER — Other Ambulatory Visit: Payer: Self-pay | Admitting: Adult Health

## 2020-08-02 ENCOUNTER — Telehealth: Payer: Self-pay

## 2020-08-02 DIAGNOSIS — E039 Hypothyroidism, unspecified: Secondary | ICD-10-CM

## 2020-08-02 NOTE — Telephone Encounter (Signed)
Confirmed and screened for 08-07-20 ov.

## 2020-08-07 ENCOUNTER — Other Ambulatory Visit: Payer: Self-pay

## 2020-08-07 ENCOUNTER — Encounter: Payer: Self-pay | Admitting: Nurse Practitioner

## 2020-08-07 ENCOUNTER — Ambulatory Visit: Payer: 59 | Admitting: Nurse Practitioner

## 2020-08-07 VITALS — BP 122/79 | HR 67 | Temp 98.6°F | Resp 16 | Ht 65.0 in | Wt 184.2 lb

## 2020-08-07 DIAGNOSIS — R319 Hematuria, unspecified: Secondary | ICD-10-CM

## 2020-08-07 DIAGNOSIS — E039 Hypothyroidism, unspecified: Secondary | ICD-10-CM

## 2020-08-07 DIAGNOSIS — I10 Essential (primary) hypertension: Secondary | ICD-10-CM | POA: Diagnosis not present

## 2020-08-07 DIAGNOSIS — N390 Urinary tract infection, site not specified: Secondary | ICD-10-CM | POA: Diagnosis not present

## 2020-08-07 DIAGNOSIS — R300 Dysuria: Secondary | ICD-10-CM | POA: Diagnosis not present

## 2020-08-07 DIAGNOSIS — Z6831 Body mass index (BMI) 31.0-31.9, adult: Secondary | ICD-10-CM | POA: Insufficient documentation

## 2020-08-07 DIAGNOSIS — Z6830 Body mass index (BMI) 30.0-30.9, adult: Secondary | ICD-10-CM | POA: Insufficient documentation

## 2020-08-07 DIAGNOSIS — F411 Generalized anxiety disorder: Secondary | ICD-10-CM

## 2020-08-07 LAB — POCT URINALYSIS DIPSTICK
Bilirubin, UA: NEGATIVE
Glucose, UA: NEGATIVE
Ketones, UA: NEGATIVE
Leukocytes, UA: NEGATIVE
Nitrite, UA: NEGATIVE
Protein, UA: NEGATIVE
Spec Grav, UA: 1.01 (ref 1.010–1.025)
Urobilinogen, UA: 0.2 E.U./dL
pH, UA: 5 (ref 5.0–8.0)

## 2020-08-07 MED ORDER — SULFAMETHOXAZOLE-TRIMETHOPRIM 400-80 MG PO TABS: 1.0000 | ORAL_TABLET | Freq: Two times a day (BID) | ORAL | 0 refills | Status: DC

## 2020-08-07 MED ORDER — ALPRAZOLAM 0.25 MG PO TABS: 0.2500 mg | ORAL_TABLET | Freq: Two times a day (BID) | ORAL | 2 refills | Status: DC | PRN

## 2020-08-07 NOTE — Progress Notes (Signed)
[facility] [place], [place] [address]  Internal MEDICINE  Office Visit Note  Patient Name: Grace Christensen  [id number]  [id number]  Date of Service: 08/07/2020  Chief Complaint  Patient presents with  . Follow-up    possible UTI, frequency, lower back pain, doesnt have paperwork for biometric screening  . Hypertension  . Quality Metric Gaps    TDAP, mammogram    The patient is here for follow up visit.  -biometric screening - the patient is on waiting list to start seeing provider for medically managed weight loss. She has friend which recommended her to this program. In meantime, will follow 1200 to 1500 calorie diet and incorporate exercise into her daily routine.  -low back pain with urinary frequency for about a week. She has felt nauseated but has not had vomiting.  -she needs to have refills of routine medications.       Current Medication: Outpatient Encounter Medications as of 08/07/2020  Medication Sig  . ALPRAZolam (XANAX) 0.25 MG tablet Take 1 tablet (0.25 mg total) by mouth 2 (two) times daily as needed for anxiety.  Marland Kitchen aspirin EC 81 MG tablet Take 81 mg by mouth daily.  . B Complex Vitamins (B COMPLEX PO) Place 1 mL under the tongue daily.  . Biotin 1000 MCG tablet Take 1,000 mcg by mouth daily.  . bisoprolol (ZEBETA) 10 MG tablet Take 1 tablet (10 mg total) by mouth at bedtime.  Marland Kitchen buPROPion (WELLBUTRIN XL) 300 MG 24 hr tablet Take 1 tablet (300 mg total) by mouth every morning.  . Calcium Carb-Cholecalciferol (CALCIUM 600+D3) 600-800 MG-UNIT TABS Take 1 tablet by mouth daily.  . Cholecalciferol (VITAMIN D) 2000 units tablet Take 2,000 Units by mouth daily.  . fexofenadine (ALLEGRA) 180 MG tablet Take 180 mg by mouth every morning. Allergy Relief   . levothyroxine (SYNTHROID) 50 MCG tablet TAKE 1 TABLET BY MOUTH AT  BEDTIME  . Omega-3 Fatty Acids (OMEGA-3 FISH OIL) 1200 MG CAPS Take 1-2 capsules by mouth See admin instructions. 1 capsule in  the morning, and 2 capsules in the evening  . Potassium 99 MG TABS Take 1 tablet by mouth daily.  . sertraline (ZOLOFT) 100 MG tablet Take 2 tablets (200 mg total) by mouth every morning.  . traZODone (DESYREL) 100 MG tablet TAKE 3 TABLETS BY MOUTH AT  BEDTIME AS NEEDED  . vitamin B-12 (CYANOCOBALAMIN) 1000 MCG tablet Take 1,000 mcg by mouth daily.  . [DISCONTINUED] ALPRAZolam (XANAX) 0.25 MG tablet Take 1 tablet (0.25 mg total) by mouth 2 (two) times daily as needed for anxiety.  . sulfamethoxazole-trimethoprim (BACTRIM) 400-80 MG tablet Take 1 tablet by mouth 2 (two) times daily.   No facility-administered encounter medications on file as of 08/07/2020.    Surgical History: Past Surgical History:  Procedure Laterality Date  . COLONOSCOPY    . FUNCTIONAL ENDOSCOPIC SINUS SURGERY    . KNEE ARTHROSCOPY Right 03/04/2017   Procedure: ARTHROSCOPY KNEE, REMOVAL OF LOOSE BODY. CHONDROPLASTY MEDIAL COMPARTMENT;  Surgeon: Dereck Leep, MD;  Location: [facility] ORS;  Service: Orthopedics;  Laterality: Right;  . NOVASURE ABLATION    . TUBAL LIGATION      Medical History: Past Medical History:  Diagnosis Date  . Anemia    H/O YEARS AGO  . Cancer ([place])    SKIN CANCER ON FACE  . GERD (gastroesophageal reflux disease)   . Hypertension   . Hypothyroidism     Family History: Family History  Problem  Relation Age of Onset  . Cancer Mother   . Hypertension Mother   . Prostate cancer Father     Social History   Socioeconomic History  . Marital status: Married    Spouse name: Not on file  . Number of children: Not on file  . Years of education: Not on file  . Highest education level: Not on file  Occupational History  . Not on file  Tobacco Use  . Smoking status: Former Smoker    Packs/day: 1.00    Years: 15.00    Pack years: 15.00    Types: Cigarettes    Quit date: 07/27/2009    Years since quitting: 11.0  . Smokeless tobacco: Never Used  Substance and Sexual Activity  . Alcohol  use: Yes    Comment: [place] [employer]EEK[employer]   . Drug use: No  . Sexual activity: Not on file  Other Topics Concern  . Not on file  Social History Narrative  . Not on file   Social Determinants of Health   Financial Resource Strain:   . Difficulty of Paying Living Expenses: Not on file  Food Insecurity:   . Worried About Charity fundraiser in the Last Year: Not on file  . Ran Out of Food in the Last Year: Not on file  Transportation Needs:   . Lack of Transportation (Medical): Not on file  . Lack of Transportation (Non-Medical): Not on file  Physical Activity:   . Days of Exercise per Week: Not on file  . Minutes of Exercise per Session: Not on file  Stress:   . Feeling of Stress : Not on file  Social Connections:   . Frequency of Communication with Friends and Family: Not on file  . Frequency of Social Gatherings with Friends and Family: Not on file  . Attends Religious Services: Not on file  . Active Member of Clubs or Organizations: Not on file  . Attends Archivist Meetings: Not on file  . Marital Status: Not on file  Intimate Partner Violence:   . Fear of Current or Ex-Partner: Not on file  . Emotionally Abused: Not on file  . Physically Abused: Not on file  . Sexually Abused: Not on file      Review of Systems  Constitutional: Positive for fatigue. Negative for chills and unexpected weight change.  HENT: Negative for congestion, postnasal drip, rhinorrhea, sneezing and sore throat.   Respiratory: Negative for cough, chest tightness, shortness of breath and wheezing.   Cardiovascular: Negative for chest pain and palpitations.  Gastrointestinal: Positive for nausea. Negative for abdominal pain, constipation, diarrhea and vomiting.  Endocrine: Negative for cold intolerance, heat intolerance, polydipsia and polyuria.       Recent thyroid panel normal   Genitourinary: Positive for flank pain, frequency and urgency. Negative for dysuria.  Musculoskeletal: Positive for  back pain. Negative for arthralgias, joint swelling and neck pain.  Skin: Negative for rash.  Allergic/Immunologic: Negative for environmental allergies.  Neurological: Negative for dizziness, tremors, numbness and headaches.  Hematological: Negative for adenopathy. Does not bruise/bleed easily.  Psychiatric/Behavioral: Positive for dysphoric mood. Negative for behavioral problems (Depression), sleep disturbance and suicidal ideas. The patient is nervous/anxious.        Well managed with current medication.     Today's Vitals   08/07/20 1459  BP: 122/79  Pulse: 67  Resp: 16  Temp: 98.6 F (37 C)  SpO2: 93%  Weight: 184 lb 3.2 oz (83.6 kg)  Height: 5'  5" (1.651 m)   Body mass index is 30.65 kg/m.  Physical Exam Vitals and nursing note reviewed.  Constitutional:      General: She is not in acute distress.    Appearance: Normal appearance. She is well-developed. She is not diaphoretic.  HENT:     Head: Normocephalic and atraumatic.     Mouth/Throat:     Pharynx: No oropharyngeal exudate.  Eyes:     Pupils: Pupils are equal, round, and reactive to light.  Neck:     Thyroid: No thyromegaly.     Vascular: No carotid bruit or JVD.     Trachea: No tracheal deviation.  Cardiovascular:     Rate and Rhythm: Normal rate and regular rhythm.     Heart sounds: Normal heart sounds. No murmur heard.  No friction rub. No gallop.   Pulmonary:     Effort: Pulmonary effort is normal. No respiratory distress.     Breath sounds: Normal breath sounds. No wheezing or rales.  Chest:     Chest wall: No tenderness.  Abdominal:     Palpations: Abdomen is soft.  Genitourinary:    Comments: U/a today is positive for moderate blood.  Musculoskeletal:        General: Normal range of motion.     Cervical back: Normal range of motion and neck supple.  Lymphadenopathy:     Cervical: No cervical adenopathy.  Skin:    General: Skin is warm and dry.  Neurological:     Mental Status: She is alert  and oriented to person, place, and time.     Cranial Nerves: No cranial nerve deficit.  Psychiatric:        Mood and Affect: Mood normal.        Behavior: Behavior normal.        Thought Content: Thought content normal.        Judgment: Judgment normal.   Assessment/Plan: 1. Urinary tract infection with hematuria, site unspecified Start bactrim twice daily for 7 days. Send urine for culture and sensitivity and adjust antibiotics as indicated.  - sulfamethoxazole-trimethoprim (BACTRIM) 400-80 MG tablet; Take 1 tablet by mouth 2 (two) times daily.  Dispense: 14 tablet; Refill: 0  2. Dysuria Positive for blood. Will treat for infection and adjust medication as indicated.  - POCT Urinalysis Dipstick  3. Essential hypertension Stable. Continue bp medication as prescribed   4. Acquired hypothyroidism Thyroid panel stable. Continue levothyroxine as prescribed   5. Generalized anxiety disorder Continue sertraline and bupropion as prescribed. May take alprazolam 0.25mg  up to twice daily as needed for acute anxiety. New prescription sent to her pharmacy today.  - ALPRAZolam (XANAX) 0.25 MG tablet; Take 1 tablet (0.25 mg total) by mouth 2 (two) times daily as needed for anxiety.  Dispense: 60 tablet; Refill: 2  6. BMI 30.0-30.9,adult Will fill out biometric form from her employer and return to her as soon as possible. Patient to follow a 1200-1500 calorie diet and incorporate exercise into her daily routine. She is currently awaiting appointment with weight loss clinic for further evaluation and treatment.    General Counseling: calise dunckel understanding of the findings of todays visit and agrees with plan of treatment. I have discussed any further diagnostic evaluation that may be needed or ordered today. We also reviewed her medications today. she has been encouraged to call the office with any questions or concerns that should arise related to todays visit.  This patient was seen by  Leretha Pol FNP Collaboration  with Dr Lavera Guise as a part of collaborative care agreement  Orders Placed This Encounter  Procedures  . CULTURE, URINE COMPREHENSIVE  . POCT Urinalysis Dipstick    Meds ordered this encounter  Medications  . ALPRAZolam (XANAX) 0.25 MG tablet    Sig: Take 1 tablet (0.25 mg total) by mouth 2 (two) times daily as needed for anxiety.    Dispense:  60 tablet    Refill:  2    Order Specific Question:   Supervising Provider    Answer:   Lavera Guise [[id number]]  . sulfamethoxazole-trimethoprim (BACTRIM) 400-80 MG tablet    Sig: Take 1 tablet by mouth 2 (two) times daily.    Dispense:  14 tablet    Refill:  0    Order Specific Question:   Supervising Provider    Answer:   Lavera Guise [[id number]]    Total time spent: 30 Minutes   Time spent includes review of chart, medications, test results, and follow up plan with the patient.      Dr Lavera Guise Internal medicine

## 2020-08-09 LAB — CULTURE, URINE COMPREHENSIVE

## 2020-08-10 ENCOUNTER — Ambulatory Visit: Payer: 59 | Admitting: Nurse Practitioner

## 2020-09-15 ENCOUNTER — Other Ambulatory Visit: Payer: Self-pay | Admitting: Adult Health

## 2020-09-15 DIAGNOSIS — F5101 Primary insomnia: Secondary | ICD-10-CM

## 2020-12-19 ENCOUNTER — Other Ambulatory Visit: Payer: Self-pay

## 2020-12-19 DIAGNOSIS — I10 Essential (primary) hypertension: Secondary | ICD-10-CM

## 2020-12-19 DIAGNOSIS — F411 Generalized anxiety disorder: Secondary | ICD-10-CM

## 2020-12-19 MED ORDER — BISOPROLOL FUMARATE 10 MG PO TABS: 10.0000 mg | ORAL_TABLET | Freq: Every day | ORAL | 0 refills | Status: DC

## 2020-12-19 MED ORDER — SERTRALINE HCL 100 MG PO TABS: 200.0000 mg | ORAL_TABLET | ORAL | 0 refills | Status: DC

## 2020-12-19 MED ORDER — BUPROPION HCL ER (XL) 300 MG PO TB24: 300.0000 mg | ORAL_TABLET | ORAL | 0 refills | Status: DC

## 2020-12-24 ENCOUNTER — Encounter: Payer: Self-pay | Admitting: Physician Assistant

## 2020-12-24 ENCOUNTER — Encounter: Payer: 59 | Admitting: Adult Health

## 2020-12-24 ENCOUNTER — Ambulatory Visit: Payer: 59 | Admitting: Internal Medicine

## 2020-12-24 DIAGNOSIS — E782 Mixed hyperlipidemia: Secondary | ICD-10-CM

## 2020-12-24 DIAGNOSIS — Z0001 Encounter for general adult medical examination with abnormal findings: Secondary | ICD-10-CM | POA: Diagnosis not present

## 2020-12-24 DIAGNOSIS — R300 Dysuria: Secondary | ICD-10-CM

## 2020-12-24 DIAGNOSIS — F5105 Insomnia due to other mental disorder: Secondary | ICD-10-CM

## 2020-12-24 DIAGNOSIS — Z6830 Body mass index (BMI) 30.0-30.9, adult: Secondary | ICD-10-CM

## 2020-12-24 DIAGNOSIS — I10 Essential (primary) hypertension: Secondary | ICD-10-CM

## 2020-12-24 DIAGNOSIS — F409 Phobic anxiety disorder, unspecified: Secondary | ICD-10-CM

## 2020-12-24 DIAGNOSIS — F3342 Major depressive disorder, recurrent, in full remission: Secondary | ICD-10-CM

## 2020-12-24 NOTE — Progress Notes (Unsigned)
[facility] [place], [place] [address]  Internal MEDICINE  Office Visit Note  Patient Name: Grace Christensen  [id number]  [id number]  Date of Service: 12/27/2020  Chief Complaint  Patient presents with  . Annual Exam  . Gastroesophageal Reflux  . Hypertension  . Medication Refill     HPI Pt is here for routine health maintenance examination. Overall she is feels well, BP is elevated periodically. Not happy with weight control. Pt did have mammogram last yr 05/2020. BMD in 2020 which showed mild osteopenia, does take calcium and vit D  Current Medication: Outpatient Encounter Medications as of 12/24/2020  Medication Sig  . ALPRAZolam (XANAX) 0.25 MG tablet Take 1 tablet (0.25 mg total) by mouth 2 (two) times daily as needed for anxiety.  Marland Kitchen aspirin EC 81 MG tablet Take 81 mg by mouth daily.  . B Complex Vitamins (B COMPLEX PO) Place 1 mL under the tongue daily.  . Biotin 1000 MCG tablet Take 1,000 mcg by mouth daily.  . bisoprolol (ZEBETA) 10 MG tablet Take 1 tablet (10 mg total) by mouth at bedtime.  Marland Kitchen buPROPion (WELLBUTRIN XL) 300 MG 24 hr tablet Take 1 tablet (300 mg total) by mouth every morning.  . Calcium Carb-Cholecalciferol (CALCIUM 600+D3) 600-800 MG-UNIT TABS Take 1 tablet by mouth daily.  . Cholecalciferol (VITAMIN D) 2000 units tablet Take 2,000 Units by mouth daily.  . fexofenadine (ALLEGRA) 180 MG tablet Take 180 mg by mouth every morning. Allergy Relief   . levothyroxine (SYNTHROID) 50 MCG tablet TAKE 1 TABLET BY MOUTH AT  BEDTIME  . Omega-3 Fatty Acids (OMEGA-3 FISH OIL) 1200 MG CAPS Take 1-2 capsules by mouth See admin instructions. 1 capsule in the morning, and 2 capsules in the evening  . Potassium 99 MG TABS Take 1 tablet by mouth daily.  . sertraline (ZOLOFT) 100 MG tablet Take 2 tablets (200 mg total) by mouth every morning.  . sulfamethoxazole-trimethoprim (BACTRIM) 400-80 MG tablet Take 1 tablet by mouth 2 (two) times daily.  .  traZODone (DESYREL) 100 MG tablet TAKE 3 TABLETS BY MOUTH AT  BEDTIME AS NEEDED  . vitamin B-12 (CYANOCOBALAMIN) 1000 MCG tablet Take 1,000 mcg by mouth daily.   No facility-administered encounter medications on file as of 12/24/2020.    Surgical History: Past Surgical History:  Procedure Laterality Date  . COLONOSCOPY    . FUNCTIONAL ENDOSCOPIC SINUS SURGERY    . KNEE ARTHROSCOPY Right 03/04/2017   Procedure: ARTHROSCOPY KNEE, REMOVAL OF LOOSE BODY. CHONDROPLASTY MEDIAL COMPARTMENT;  Surgeon: Dereck Leep, MD;  Location: [facility] ORS;  Service: Orthopedics;  Laterality: Right;  . NOVASURE ABLATION    . TUBAL LIGATION      Medical History: Past Medical History:  Diagnosis Date  . Anemia    H/O YEARS AGO  . Cancer ([place])    SKIN CANCER ON FACE  . GERD (gastroesophageal reflux disease)   . Hypertension   . Hypothyroidism     Family History: Family History  Problem Relation Age of Onset  . Cancer Mother   . Hypertension Mother   . Prostate cancer Father     Social History: Social History   Socioeconomic History  . Marital status: Married    Spouse name: Not on file  . Number of children: Not on file  . Years of education: Not on file  . Highest education level: Not on file  Occupational History  . Not on file  Tobacco Use  . Smoking  status: Former Smoker    Packs/day: 1.00    Years: 15.00    Pack years: 15.00    Types: Cigarettes    Quit date: 07/27/2009    Years since quitting: 11.4  . Smokeless tobacco: Never Used  Substance and Sexual Activity  . Alcohol use: Yes    Comment: [place] [employer]EEK[employer]   . Drug use: No  . Sexual activity: Not on file  Other Topics Concern  . Not on file  Social History Narrative  . Not on file   Social Determinants of Health   Financial Resource Strain: Not on file  Food Insecurity: Not on file  Transportation Needs: Not on file  Physical Activity: Not on file  Stress: Not on file  Social Connections: Not on file      Review of  Systems  Constitutional: Negative for chills, diaphoresis and fatigue.  HENT: Negative for ear pain, postnasal drip and sinus pressure.   Eyes: Negative for photophobia, discharge, redness, itching and visual disturbance.  Respiratory: Negative for cough, shortness of breath and wheezing.   Cardiovascular: Negative for chest pain, palpitations and leg swelling.  Gastrointestinal: Negative for abdominal pain, constipation, diarrhea, nausea and vomiting.  Genitourinary: Negative for dysuria and flank pain.  Musculoskeletal: Negative for arthralgias, back pain, gait problem and neck pain.  Skin: Negative for color change.  Allergic/Immunologic: Negative for environmental allergies and food allergies.  Neurological: Negative for dizziness and headaches.  Hematological: Does not bruise/bleed easily.  Psychiatric/Behavioral: Negative for agitation, behavioral problems (depression) and hallucinations.     Vital Signs: BP 135/80 Comment: 138/90  Pulse 79   Temp 97.6 F (36.4 C)   Resp 16   Ht 5\' 5"  (1.651 m)   Wt 185 lb 3.2 oz (84 kg)   SpO2 99%   BMI 30.82 kg/m    Physical Exam Constitutional:      General: She is not in acute distress.    Appearance: She is well-developed. She is not diaphoretic.  HENT:     Head: Normocephalic and atraumatic.     Mouth/Throat:     Pharynx: No oropharyngeal exudate.  Eyes:     Pupils: Pupils are equal, round, and reactive to light.  Neck:     Thyroid: No thyromegaly.     Vascular: No JVD.     Trachea: No tracheal deviation.  Cardiovascular:     Rate and Rhythm: Normal rate and regular rhythm.     Heart sounds: Normal heart sounds. No murmur heard. No friction rub. No gallop.   Pulmonary:     Effort: Pulmonary effort is normal. No respiratory distress.     Breath sounds: No wheezing or rales.  Chest:     Chest wall: No tenderness.  Breasts:     Right: Normal. No mass.     Left: Normal. No mass.    Abdominal:     General: Bowel  sounds are normal.     Palpations: Abdomen is soft.  Musculoskeletal:        General: Normal range of motion.     Cervical back: Normal range of motion and neck supple.  Lymphadenopathy:     Cervical: No cervical adenopathy.  Skin:    General: Skin is warm and dry.  Neurological:     Mental Status: She is alert and oriented to person, place, and time.     Cranial Nerves: No cranial nerve deficit.  Psychiatric:        Behavior: Behavior normal.  Thought Content: Thought content normal.        Judgment: Judgment normal.    Assessment/Plan: 1. Encounter for general adult medical examination with abnormal findings Age appropriate labs and diagnostic are up to date, will need mammogram June 2022.   2. Essential hypertension BP is elevated however repeat was WNL, will continue to monitor   3. Mixed hyperlipidemia Abnormal Lipid profile last year on dietry, life style changes, repeat labs if not improvement seen, will start Lipitor 10 mg po 2 x week   4. Insomnia due to anxiety and fear Taper Xanax to only as needed, continue Trazodone as before   5. Recurrent major depressive disorder, in full remission ([place]) Stable on Zoloft and Wellbutrin, continue as before   6. BMI 30.0-30.9,adult Encouraged  Wt loss, health life style changes   7. Dysuria - UA/M w/rflx Culture, Routine   General Counseling: Jakala verbalizes understanding of the findings of todays visit and agrees with plan of treatment. I have discussed any further diagnostic evaluation that may be needed or ordered today. We also reviewed her medications today. she has been encouraged to call the office with any questions or concerns that should arise related to todays visit.  Counseling: Hypertension Counseling:   The following hypertensive lifestyle modification were recommended and discussed:  1. Limiting alcohol intake to less than 1 oz/day of ethanol:(24 oz of beer or 8 oz of wine or 2 oz of 100-proof  whiskey). 2. Take baby ASA 81 mg daily. 3. Importance of regular aerobic exercise and losing weight. 4. Reduce dietary saturated fat and cholesterol intake for overall cardiovascular health. 5. Maintaining adequate dietary potassium, calcium, and magnesium intake. 6. Regular monitoring of the blood pressure. 7. Reduce sodium intake to less than 100 mmol/day (less than 2.3 gm of sodium or less than 6 gm of sodium choride)  [place] Controlled Substance Database was reviewed by me.  Orders Placed This Encounter  Procedures  . Microscopic Examination  . UA/M w/rflx Culture, Routine  . CBC with Differential/Platelet  . Lipid Panel With LDL/HDL Ratio  . TSH  . T4, free  . Comprehensive metabolic panel     Total time spent:35 Minutes  Time spent includes review of chart, medications, test results, and follow up plan with the patient.     Lavera Guise, MD  Internal Medicine

## 2020-12-25 LAB — MICROSCOPIC EXAMINATION
Bacteria, UA: NONE SEEN
Casts: NONE SEEN /lpf
Epithelial Cells (non renal): NONE SEEN /hpf (ref 0–10)
WBC, UA: NONE SEEN /hpf (ref 0–5)

## 2020-12-25 LAB — UA/M W/RFLX CULTURE, ROUTINE
Bilirubin, UA: NEGATIVE
Glucose, UA: NEGATIVE
Ketones, UA: NEGATIVE
Leukocytes,UA: NEGATIVE
Nitrite, UA: NEGATIVE
Protein,UA: NEGATIVE
Specific Gravity, UA: 1.006 (ref 1.005–1.030)
Urobilinogen, Ur: 0.2 mg/dL (ref 0.2–1.0)
pH, UA: 6 (ref 5.0–7.5)

## 2020-12-27 ENCOUNTER — Encounter: Payer: Self-pay | Admitting: Internal Medicine

## 2021-01-04 LAB — COMPREHENSIVE METABOLIC PANEL
ALT: 13 IU/L (ref 0–32)
AST: 15 IU/L (ref 0–40)
Albumin/Globulin Ratio: 1.6 (ref 1.2–2.2)
Albumin: 4.4 g/dL (ref 3.8–4.9)
Alkaline Phosphatase: 75 IU/L (ref 44–121)
BUN/Creatinine Ratio: 15 (ref 12–28)
BUN: 9 mg/dL (ref 8–27)
Bilirubin Total: 0.4 mg/dL (ref 0.0–1.2)
CO2: 26 mmol/L (ref 20–29)
Calcium: 9.5 mg/dL (ref 8.7–10.3)
Chloride: 97 mmol/L (ref 96–106)
Creatinine, Ser: 0.6 mg/dL (ref 0.57–1.00)
GFR calc Af Amer: 115 mL/min/{1.73_m2} (ref >59)
GFR calc non Af Amer: 99 mL/min/{1.73_m2} (ref >59)
Globulin, Total: 2.8 g/dL (ref 1.5–4.5)
Glucose: 71 mg/dL (ref 65–99)
Potassium: 4 mmol/L (ref 3.5–5.2)
Sodium: 137 mmol/L (ref 134–144)
Total Protein: 7.2 g/dL (ref 6.0–8.5)

## 2021-01-04 LAB — CBC WITH DIFFERENTIAL/PLATELET
Basophils Absolute: 0 10*3/uL (ref 0.0–0.2)
Basos: 0 %
EOS (ABSOLUTE): 0.1 10*3/uL (ref 0.0–0.4)
Eos: 2 %
Hematocrit: 43.4 % (ref 34.0–46.6)
Hemoglobin: 14.9 g/dL (ref 11.1–15.9)
Immature Grans (Abs): 0 10*3/uL (ref 0.0–0.1)
Immature Granulocytes: 0 %
Lymphocytes Absolute: 1.8 10*3/uL (ref 0.7–3.1)
Lymphs: 35 %
MCH: 30.8 pg (ref 26.6–33.0)
MCHC: 34.3 g/dL (ref 31.5–35.7)
MCV: 90 fL (ref 79–97)
Monocytes Absolute: 0.5 10*3/uL (ref 0.1–0.9)
Monocytes: 10 %
Neutrophils Absolute: 2.6 10*3/uL (ref 1.4–7.0)
Neutrophils: 53 %
Platelets: 146 10*3/uL — ABNORMAL LOW (ref 150–450)
RBC: 4.83 x10E6/uL (ref 3.77–5.28)
RDW: 12.8 % (ref 11.7–15.4)
WBC: 5.1 10*3/uL (ref 3.4–10.8)

## 2021-01-04 LAB — LIPID PANEL WITH LDL/HDL RATIO
Cholesterol, Total: 215 mg/dL — ABNORMAL HIGH (ref 100–199)
HDL: 49 mg/dL (ref >39)
LDL Chol Calc (NIH): 140 mg/dL — ABNORMAL HIGH (ref 0–99)
LDL/HDL Ratio: 2.9 ratio (ref 0.0–3.2)
Triglycerides: 146 mg/dL (ref 0–149)
VLDL Cholesterol Cal: 26 mg/dL (ref 5–40)

## 2021-01-04 LAB — TSH: TSH: 4.68 u[IU]/mL — ABNORMAL HIGH (ref 0.450–4.500)

## 2021-01-04 LAB — T4, FREE: Free T4: 1.34 ng/dL (ref 0.82–1.77)

## 2021-01-04 NOTE — Progress Notes (Signed)
Review U/A ( RBC), might need renal u/s

## 2021-01-24 ENCOUNTER — Ambulatory Visit: Payer: 59 | Admitting: Physician Assistant

## 2021-01-24 ENCOUNTER — Encounter: Payer: Self-pay | Admitting: Nurse Practitioner

## 2021-01-24 ENCOUNTER — Ambulatory Visit: Payer: 59 | Admitting: Nurse Practitioner

## 2021-01-24 ENCOUNTER — Other Ambulatory Visit: Payer: Self-pay

## 2021-01-24 VITALS — BP 128/81 | HR 78 | Temp 98.4°F | Ht 65.0 in | Wt 186.6 lb

## 2021-01-24 DIAGNOSIS — R319 Hematuria, unspecified: Secondary | ICD-10-CM

## 2021-01-24 DIAGNOSIS — F411 Generalized anxiety disorder: Secondary | ICD-10-CM

## 2021-01-24 DIAGNOSIS — E039 Hypothyroidism, unspecified: Secondary | ICD-10-CM

## 2021-01-24 DIAGNOSIS — E782 Mixed hyperlipidemia: Secondary | ICD-10-CM | POA: Insufficient documentation

## 2021-01-24 DIAGNOSIS — Z7689 Persons encountering health services in other specified circumstances: Secondary | ICD-10-CM | POA: Diagnosis not present

## 2021-01-24 DIAGNOSIS — N390 Urinary tract infection, site not specified: Secondary | ICD-10-CM | POA: Diagnosis not present

## 2021-01-24 DIAGNOSIS — R300 Dysuria: Secondary | ICD-10-CM | POA: Diagnosis not present

## 2021-01-24 DIAGNOSIS — F324 Major depressive disorder, single episode, in partial remission: Secondary | ICD-10-CM

## 2021-01-24 DIAGNOSIS — I10 Essential (primary) hypertension: Secondary | ICD-10-CM

## 2021-01-24 LAB — POCT URINALYSIS DIPSTICK
Bilirubin, UA: NEGATIVE
Glucose, UA: NEGATIVE
Ketones, UA: NEGATIVE
Leukocytes, UA: NEGATIVE
Nitrite, UA: NEGATIVE
Protein, UA: POSITIVE — AB
Spec Grav, UA: 1.015 (ref 1.010–1.025)
Urobilinogen, UA: 0.2 E.U./dL
pH, UA: 7 (ref 5.0–8.0)

## 2021-01-24 MED ORDER — SERTRALINE HCL 100 MG PO TABS: 200.0000 mg | ORAL_TABLET | ORAL | 1 refills | Status: DC

## 2021-01-24 MED ORDER — BISOPROLOL FUMARATE 10 MG PO TABS: 10.0000 mg | ORAL_TABLET | Freq: Every day | ORAL | 1 refills | Status: DC

## 2021-01-24 MED ORDER — SULFAMETHOXAZOLE-TRIMETHOPRIM 800-160 MG PO TABS
1.0000 | ORAL_TABLET | Freq: Two times a day (BID) | ORAL | 0 refills | Status: DC
Start: 2021-01-24 — End: 2021-05-14

## 2021-01-24 MED ORDER — LEVOTHYROXINE SODIUM 75 MCG PO TABS: 75.0000 ug | ORAL_TABLET | Freq: Every day | ORAL | 0 refills | Status: DC

## 2021-01-24 MED ORDER — ALPRAZOLAM 0.25 MG PO TABS
0.2500 mg | ORAL_TABLET | Freq: Two times a day (BID) | ORAL | 2 refills | Status: DC | PRN
Start: 2021-01-24 — End: 2021-08-20

## 2021-01-24 MED ORDER — BUPROPION HCL ER (XL) 300 MG PO TB24: 300.0000 mg | ORAL_TABLET | ORAL | 1 refills | Status: DC

## 2021-01-24 NOTE — Patient Instructions (Signed)
Urinary Tract Infection, Adult A urinary tract infection (UTI) is an infection of any part of the urinary tract. The urinary tract includes:  The kidneys.  The ureters.  The bladder.  The urethra. These organs make, store, and get rid of pee (urine) in the body. What are the causes? This infection is caused by germs (bacteria) in your genital area. These germs grow and cause swelling (inflammation) of your urinary tract. What increases the risk? The following factors may make you more likely to develop this condition:  Using a small, thin tube (catheter) to drain pee.  Not being able to control when you pee or poop (incontinence).  Being female. If you are female, these things can increase the risk: ? Using these methods to prevent pregnancy:  A medicine that kills sperm (spermicide).  A device that blocks sperm (diaphragm). ? Having low levels of a female hormone (estrogen). ? Being pregnant. You are more likely to develop this condition if:  You have genes that add to your risk.  You are sexually active.  You take antibiotic medicines.  You have trouble peeing because of: ? A prostate that is bigger than normal, if you are female. ? A blockage in the part of your body that drains pee from the bladder. ? A kidney stone. ? A nerve condition that affects your bladder. ? Not getting enough to drink. ? Not peeing often enough.  You have other conditions, such as: ? Diabetes. ? A weak disease-fighting system (immune system). ? Sickle cell disease. ? Gout. ? Injury of the spine. What are the signs or symptoms? Symptoms of this condition include:  Needing to pee right away.  Peeing small amounts often.  Pain or burning when peeing.  Blood in the pee.  Pee that smells bad or not like normal.  Trouble peeing.  Pee that is cloudy.  Fluid coming from the vagina, if you are female.  Pain in the belly or lower back. Other symptoms include:  Vomiting.  Not  feeling hungry.  Feeling mixed up (confused). This may be the first symptom in older adults.  Being tired and grouchy (irritable).  A fever.  Watery poop (diarrhea). How is this treated?  Taking antibiotic medicine.  Taking other medicines.  Drinking enough water. In some cases, you may need to see a specialist. Follow these instructions at home: Medicines  Take over-the-counter and prescription medicines only as told by your doctor.  If you were prescribed an antibiotic medicine, take it as told by your doctor. Do not stop taking it even if you start to feel better. General instructions  Make sure you: ? Pee until your bladder is empty. ? Do not hold pee for a long time. ? Empty your bladder after sex. ? Wipe from front to back after peeing or pooping if you are a female. Use each tissue one time when you wipe.  Drink enough fluid to keep your pee pale yellow.  Keep all follow-up visits.   Contact a doctor if:  You do not get better after 1-2 days.  Your symptoms go away and then come back. Get help right away if:  You have very bad back pain.  You have very bad pain in your lower belly.  You have a fever.  You have chills.  You feeling like you will vomit or you vomit. Summary  A urinary tract infection (UTI) is an infection of any part of the urinary tract.  This condition is caused by   germs in your genital area.  There are many risk factors for a UTI.  Treatment includes antibiotic medicines.  Drink enough fluid to keep your pee pale yellow. This information is not intended to replace advice given to you by your health care provider. Make sure you discuss any questions you have with your health care provider. Document Revised: 06/29/2020 Document Reviewed: 06/29/2020 Elsevier Patient Education  2021 [employer]  

## 2021-01-24 NOTE — Progress Notes (Signed)
Started bactrim DS bid for 7 days. Sent urine for culture and sensitivity. Will repeat u/a in two weeks. KUB or refer to urology as indicated,.

## 2021-01-24 NOTE — Progress Notes (Signed)
New Patient Office Visit  Subjective:  Patient ID: Grace Christensen, female    DOB: 29-Apr-1961  Age: 60 y.o. MRN: [id number]  CC:  Chief Complaint  Patient presents with  . New Patient (Initial Visit)    HPI Grace Christensen presents for establishment of new primary care provider. Today, she is complaining of moderate pelvic pain, urinary frequency, and urgency. She states that she did have a low grade fever last night. She was able to take ibuprofen last night. The ibuprofen did help her pain and brought down her fever. She states that she has had some mild nausea. She denies vomiting or diarrhea.  She had her CPE 12/24/2020 with previous provider. She did have fasting blood work done after that visit. Her lipid panel was abnormal. Her total cholesterol was 215 with her LDL at 140. Her LDL/HDL ratio was 2.9. She does take fish oil everyday. She admits that she has "slacked off" on regular dosing of this over past few months. These values have improved since last year. She states that she is exercising daily. She has a foot pedal which she is able to use off and on during the day. She has been doing this for past several months.  Her labs also indicated that thyroid was slightly underactive. TSH was 4.680, up from 2.550 at check last year. She is currently on levothyroxine 62mcg daily.  She is treated for generalized anxiety and depression. Her symptoms have been stable and well managed with current medications. She takes sertraline and wellbutrin every day. She does take trazodone at night to help with insomnia which is related to her anxiety. She does take alprazolam 0.25mg  in the evening as needed and does have a second dose to take as needed. PDMP reviewed today 01/24/2021. She does need to have refills for her medications today.   Past Medical History:  Diagnosis Date  . Anemia    H/O YEARS AGO  . Cancer ([place])    SKIN CANCER ON FACE  . GERD (gastroesophageal reflux disease)   . Hypertension    . Hypothyroidism   . Thyroid disease    Phreesia 01/21/2021    Past Surgical History:  Procedure Laterality Date  . COLONOSCOPY    . FUNCTIONAL ENDOSCOPIC SINUS SURGERY    . KNEE ARTHROSCOPY Right 03/04/2017   Procedure: ARTHROSCOPY KNEE, REMOVAL OF LOOSE BODY. CHONDROPLASTY MEDIAL COMPARTMENT;  Surgeon: Dereck Leep, MD;  Location: [facility] ORS;  Service: Orthopedics;  Laterality: Right;  . NOVASURE ABLATION    . TUBAL LIGATION      Family History  Problem Relation Age of Onset  . Cancer Mother   . Hypertension Mother   . Prostate cancer Father     Social History   Socioeconomic History  . Marital status: Married    Spouse name: Not on file  . Number of children: Not on file  . Years of education: Not on file  . Highest education level: Not on file  Occupational History  . Occupation: Pensions consultant: [employer]  Tobacco Use  . Smoking status: Former Smoker    Packs/day: 1.00    Years: 15.00    Pack years: 15.00    Types: Cigarettes    Quit date: 07/27/2009    Years since quitting: 11.5  . Smokeless tobacco: Never Used  Substance and Sexual Activity  . Alcohol use: Yes    Comment: [place] [employer]   . Drug use: No  . Sexual activity:  Yes  Other Topics Concern  . Not on file  Social History Narrative  . Not on file   Social Determinants of Health   Financial Resource Strain: Not on file  Food Insecurity: Not on file  Transportation Needs: Not on file  Physical Activity: Not on file  Stress: Not on file  Social Connections: Not on file  Intimate Partner Violence: Not on file    ROS Review of Systems  Constitutional: Positive for appetite change, fatigue and fever.       Had low grade fever yesterday  Gastrointestinal: Positive for nausea.  Genitourinary: Positive for dysuria, flank pain, frequency, pelvic pain and urgency.  Musculoskeletal: Positive for back pain.  Psychiatric/Behavioral: Positive for sleep disturbance. The patient is  nervous/anxious.   All other systems reviewed and are negative.   Objective:   Today's Vitals: BP 128/81   Pulse 78   Temp 98.4 F (36.9 C)   Ht 5\' 5"  (1.651 m)   Wt 186 lb 9.6 oz (84.6 kg)   SpO2 97%   BMI 31.05 kg/m   Physical Exam Vitals and nursing note reviewed.  Constitutional:      Appearance: Normal appearance. She is ill-appearing.  HENT:     Head: Normocephalic and atraumatic.     Nose: Nose normal.  Eyes:     Extraocular Movements: Extraocular movements intact.     Pupils: Pupils are equal, round, and reactive to light.  Cardiovascular:     Rate and Rhythm: Normal rate and regular rhythm.     Pulses: Normal pulses.     Heart sounds: Normal heart sounds.  Pulmonary:     Effort: Pulmonary effort is normal.     Breath sounds: Normal breath sounds.  Abdominal:     General: Bowel sounds are normal.     Palpations: Abdomen is soft.     Tenderness: There is abdominal tenderness in the suprapubic area.     Comments: Guarding present   Genitourinary:    Comments: Urine sample is positive for small protein and large amount blood . Musculoskeletal:        General: Normal range of motion.     Cervical back: Normal range of motion and neck supple.  Skin:    General: Skin is warm and dry.     Capillary Refill: Capillary refill takes less than 2 seconds.  Neurological:     General: No focal deficit present.     Mental Status: She is alert and oriented to person, place, and time.  Psychiatric:        Mood and Affect: Mood normal.        Behavior: Behavior normal.        Thought Content: Thought content normal.        Judgment: Judgment normal.     Assessment & Plan:  1. Encounter to establish care Patient seen today to establish new primary care provider.   2. Urinary tract infection with hematuria, site unspecified U/a showing 1+protein and large amount of blood. Treat with bactrim ds twice daily for 7 days. Send urine for culture and sensitivity and adjust  antibiotic therapy as indicated. Recheck u/a in two weeks. Consider KUB and/or referral to urology if sample continues to show large amount of blood.   3. Dysuria Treat as UTI today. Recheck u/a with culture and sensitivity in two weeks. Consider diagnostics for hematuria and/or referral to urology if urine sample continues to show blood.  - POCT urinalysis dipstick - Urine Culture -  sulfamethoxazole-trimethoprim (BACTRIM DS) 800-160 MG tablet; Take 1 tablet by mouth 2 (two) times daily.  Dispense: 14 tablet; Refill: 0 - Urine Culture; Future  4. Acquired hypothyroidism TSH with recent labs indicating hypothyroid. Increase levothyroxine to 57mcg daily. Recheck thyroid panel prior to next visit and adjust dosing as indicated.  - levothyroxine (SYNTHROID) 75 MCG tablet; Take 1 tablet (75 mcg total) by mouth daily.  Dispense: 90 tablet; Refill: 0 - TSH; Future - T4, free; Future - T3; Future  5. Hypertension, unspecified type Well managed. Continue bisoprolol as prescribed  - bisoprolol (ZEBETA) 10 MG tablet; Take 1 tablet (10 mg total) by mouth at bedtime.  Dispense: 90 tablet; Refill: 1  6. Moderate mixed hyperlipidemia not requiring statin therapy Reviewed recent fasting blood work which showed Her total cholesterol was 215 with her LDL at 140. Her LDL/HDL ratio was 2.9. She does take fish oil everyday. Patient encouraged to limit intake of fried and fatty foods. Continue with increased levels of physical activity. Take fish oil daily.   7. Major depressive disorder in partial remission, unspecified whether recurrent (HCC) Stable. Continue sertraline 200mg  daily. Renewed prescription today.   8. Generalized anxiety disorder Doing well on current medication. Continue wellbutrin XL 300mg  daily. May take alprazolam 0.25mg  up to twice daily, however, patient generally taking only in the evenings. PDMP reviewed today 01/24/2021.  - ALPRAZolam (XANAX) 0.25 MG tablet; Take 1 tablet (0.25 mg  total) by mouth 2 (two) times daily as needed for anxiety.  Dispense: 60 tablet; Refill: 2 - buPROPion (WELLBUTRIN XL) 300 MG 24 hr tablet; Take 1 tablet (300 mg total) by mouth every morning.  Dispense: 90 tablet; Refill: 1  Problem List Items Addressed This Visit      Cardiovascular and Mediastinum   Hypertension   Relevant Medications   bisoprolol (ZEBETA) 10 MG tablet     Endocrine   Acquired hypothyroidism   Relevant Medications   levothyroxine (SYNTHROID) 75 MCG tablet   bisoprolol (ZEBETA) 10 MG tablet   Other Relevant Orders   TSH   T4, free   T3     Genitourinary   Urinary tract infection with hematuria   Relevant Medications   sulfamethoxazole-trimethoprim (BACTRIM DS) 800-160 MG tablet     Other   Depression   Relevant Medications   ALPRAZolam (XANAX) 0.25 MG tablet   buPROPion (WELLBUTRIN XL) 300 MG 24 hr tablet   sertraline (ZOLOFT) 100 MG tablet   Generalized anxiety disorder   Relevant Medications   ALPRAZolam (XANAX) 0.25 MG tablet   buPROPion (WELLBUTRIN XL) 300 MG 24 hr tablet   sertraline (ZOLOFT) 100 MG tablet   Dysuria   Relevant Medications   sulfamethoxazole-trimethoprim (BACTRIM DS) 800-160 MG tablet   Other Relevant Orders   POCT urinalysis dipstick (Completed)   Urine Culture   Urine Culture   Encounter to establish care - Primary   Moderate mixed hyperlipidemia not requiring statin therapy   Relevant Medications   bisoprolol (ZEBETA) 10 MG tablet      Outpatient Encounter Medications as of 01/24/2021  Medication Sig  . aspirin EC 81 MG tablet Take 81 mg by mouth daily.  . B Complex Vitamins (B COMPLEX PO) Place 1 mL under the tongue daily.  . Biotin 1000 MCG tablet Take 1,000 mcg by mouth daily.  . Calcium Carb-Cholecalciferol 600-800 MG-UNIT TABS Take 1 tablet by mouth daily.  . Cholecalciferol (VITAMIN D) 2000 units tablet Take 2,000 Units by mouth daily.  . fexofenadine (ALLEGRA) 180  MG tablet Take 180 mg by mouth every morning.  Allergy Relief  . levothyroxine (SYNTHROID) 75 MCG tablet Take 1 tablet (75 mcg total) by mouth daily.  . Omega-3 Fatty Acids (OMEGA-3 FISH OIL) 1200 MG CAPS Take 1-2 capsules by mouth See admin instructions. 1 capsule in the morning, and 2 capsules in the evening  . Potassium 99 MG TABS Take 1 tablet by mouth daily.  Marland Kitchen sulfamethoxazole-trimethoprim (BACTRIM DS) 800-160 MG tablet Take 1 tablet by mouth 2 (two) times daily.  . traZODone (DESYREL) 100 MG tablet TAKE 3 TABLETS BY MOUTH AT  BEDTIME AS NEEDED  . vitamin B-12 (CYANOCOBALAMIN) 1000 MCG tablet Take 1,000 mcg by mouth daily.  . [DISCONTINUED] ALPRAZolam (XANAX) 0.25 MG tablet Take 1 tablet (0.25 mg total) by mouth 2 (two) times daily as needed for anxiety.  . [DISCONTINUED] bisoprolol (ZEBETA) 10 MG tablet Take 1 tablet (10 mg total) by mouth at bedtime.  . [DISCONTINUED] buPROPion (WELLBUTRIN XL) 300 MG 24 hr tablet Take 1 tablet (300 mg total) by mouth every morning.  . [DISCONTINUED] levothyroxine (SYNTHROID) 50 MCG tablet TAKE 1 TABLET BY MOUTH AT  BEDTIME  . [DISCONTINUED] sertraline (ZOLOFT) 100 MG tablet Take 2 tablets (200 mg total) by mouth every morning.  . [DISCONTINUED] sulfamethoxazole-trimethoprim (BACTRIM) 400-80 MG tablet Take 1 tablet by mouth 2 (two) times daily.  Marland Kitchen ALPRAZolam (XANAX) 0.25 MG tablet Take 1 tablet (0.25 mg total) by mouth 2 (two) times daily as needed for anxiety.  . bisoprolol (ZEBETA) 10 MG tablet Take 1 tablet (10 mg total) by mouth at bedtime.  Marland Kitchen buPROPion (WELLBUTRIN XL) 300 MG 24 hr tablet Take 1 tablet (300 mg total) by mouth every morning.  . sertraline (ZOLOFT) 100 MG tablet Take 2 tablets (200 mg total) by mouth every morning.   No facility-administered encounter medications on file as of 01/24/2021.    Time spent with the patient was approximately 45 minutes. This time included reviewing progress notes, labs, imaging studies, and discussing plan for follow up.    Follow-up: Return in about 3  months (around 04/23/2021) for Thyroid, Mood.   Ronnell Freshwater, NP

## 2021-01-26 LAB — URINE CULTURE

## 2021-02-19 ENCOUNTER — Other Ambulatory Visit: Payer: Self-pay

## 2021-02-21 ENCOUNTER — Telehealth: Payer: Self-pay | Admitting: Nurse Practitioner

## 2021-02-26 NOTE — Telephone Encounter (Signed)
Opened in error

## 2021-03-05 ENCOUNTER — Telehealth: Payer: Self-pay | Admitting: Nurse Practitioner

## 2021-03-05 NOTE — Telephone Encounter (Signed)
Patient scheduled.

## 2021-03-05 NOTE — Telephone Encounter (Signed)
Please contact pt to schedule an appointment with PCP to discuss issue and need for referral. AS, CMA

## 2021-03-05 NOTE — Telephone Encounter (Signed)
Patient needs a referral to a UTI specialist as she keeps getting reoccurring UTI's. Please advise, thanks.

## 2021-03-06 ENCOUNTER — Ambulatory Visit (INDEPENDENT_AMBULATORY_CARE_PROVIDER_SITE_OTHER): Payer: 59 | Admitting: Nurse Practitioner

## 2021-03-06 ENCOUNTER — Encounter: Payer: Self-pay | Admitting: Nurse Practitioner

## 2021-03-06 VITALS — BP 129/84 | HR 65 | Ht 65.0 in | Wt 184.0 lb

## 2021-03-06 DIAGNOSIS — N390 Urinary tract infection, site not specified: Secondary | ICD-10-CM

## 2021-03-06 MED ORDER — NITROFURANTOIN MONOHYD MACRO 100 MG PO CAPS: ORAL_CAPSULE | ORAL | 2 refills | Status: DC

## 2021-03-06 NOTE — Progress Notes (Signed)
Virtual Visit via Telephone Note  I connected with Grace Christensen on 03/06/21 at 10:10 AM EDT by telephone and verified that I am speaking with the correct person using two identifiers.  Location: Patient: home Provider: [facility]    I discussed the limitations, risks, security and privacy concerns of performing an evaluation and management service by telephone and the availability of in person appointments. I also discussed with the patient that there may be a patient responsible charge related to this service. The patient expressed understanding and agreed to proceed.   History of Present Illness: The patient presents for evaluation of urinary tract infection. She states that on Monday, she started having dark colored urine. She is also experiencing bladder pain and spasms each time she urinates. She is feeling as though she has to urinate more often, but has very little urine output when she uses the toilet. She has lower abdominal pain and pressure. She has tried using OTC ibuprofen, but this has not been effective. She was seen and treated for UTI at her most recent visit 01/2021. She was treated with Bactrim DS. She states that all symptoms resolved. Urine culture was positive for overgrowth of normal flora bacteria. She states that she has been treated more than four times for UTI in the past six months. Each resolving with prescribed treatment with antibiotics.    Observations/Objective:  The patient is alert and oriented. She is pleasant and answers all questions appropriately. Breathing is non-labored. She is in no acute distress at this time.   Assessment and Plan:  1. Recurrent urinary tract infection Treat with macrobid 100mg  twice daily for 7 days. After this, she should take 100mg  daily for next three months. Will check urinalysis at next visit 04/24/2021. If positive for evidence of infection, will refer to urology.  Advised her to take OTC AZO as  needed and as indicated to help relieve bladder pain. Increase intake of water.  - nitrofurantoin, macrocrystal-monohydrate, (MACROBID) 100 MG capsule; Take 1 capsule po BID for 7 days then take 1 capsule po daily for next 3 months to prevent further UTI  Dispense: 37 capsule; Refill: 2  Follow Up Instructions:   Will check u/a at next visit 04/24/2021.  If abnormalities noted at that time, will refer to urology.    I discussed the assessment and treatment plan with the patient. The patient was provided an opportunity to ask questions and all were answered. The patient agreed with the plan and demonstrated an understanding of the instructions.   The patient was advised to call back or seek an in-person evaluation if the symptoms worsen or if the condition fails to improve as anticipated.  I provided 22 minutes of non-face-to-face time during this encounter.   Ronnell Freshwater, NP

## 2021-03-06 NOTE — Patient Instructions (Signed)
Urinary Tract Infection, Adult A urinary tract infection (UTI) is an infection of any part of the urinary tract. The urinary tract includes:  The kidneys.  The ureters.  The bladder.  The urethra. These organs make, store, and get rid of pee (urine) in the body. What are the causes? This infection is caused by germs (bacteria) in your genital area. These germs grow and cause swelling (inflammation) of your urinary tract. What increases the risk? The following factors may make you more likely to develop this condition:  Using a small, thin tube (catheter) to drain pee.  Not being able to control when you pee or poop (incontinence).  Being female. If you are female, these things can increase the risk: ? Using these methods to prevent pregnancy:  A medicine that kills sperm (spermicide).  A device that blocks sperm (diaphragm). ? Having low levels of a female hormone (estrogen). ? Being pregnant. You are more likely to develop this condition if:  You have genes that add to your risk.  You are sexually active.  You take antibiotic medicines.  You have trouble peeing because of: ? A prostate that is bigger than normal, if you are female. ? A blockage in the part of your body that drains pee from the bladder. ? A kidney stone. ? A nerve condition that affects your bladder. ? Not getting enough to drink. ? Not peeing often enough.  You have other conditions, such as: ? Diabetes. ? A weak disease-fighting system (immune system). ? Sickle cell disease. ? Gout. ? Injury of the spine. What are the signs or symptoms? Symptoms of this condition include:  Needing to pee right away.  Peeing small amounts often.  Pain or burning when peeing.  Blood in the pee.  Pee that smells bad or not like normal.  Trouble peeing.  Pee that is cloudy.  Fluid coming from the vagina, if you are female.  Pain in the belly or lower back. Other symptoms include:  Vomiting.  Not  feeling hungry.  Feeling mixed up (confused). This may be the first symptom in older adults.  Being tired and grouchy (irritable).  A fever.  Watery poop (diarrhea). How is this treated?  Taking antibiotic medicine.  Taking other medicines.  Drinking enough water. In some cases, you may need to see a specialist. Follow these instructions at home: Medicines  Take over-the-counter and prescription medicines only as told by your doctor.  If you were prescribed an antibiotic medicine, take it as told by your doctor. Do not stop taking it even if you start to feel better. General instructions  Make sure you: ? Pee until your bladder is empty. ? Do not hold pee for a long time. ? Empty your bladder after sex. ? Wipe from front to back after peeing or pooping if you are a female. Use each tissue one time when you wipe.  Drink enough fluid to keep your pee pale yellow.  Keep all follow-up visits.   Contact a doctor if:  You do not get better after 1-2 days.  Your symptoms go away and then come back. Get help right away if:  You have very bad back pain.  You have very bad pain in your lower belly.  You have a fever.  You have chills.  You feeling like you will vomit or you vomit. Summary  A urinary tract infection (UTI) is an infection of any part of the urinary tract.  This condition is caused by   germs in your genital area.  There are many risk factors for a UTI.  Treatment includes antibiotic medicines.  Drink enough fluid to keep your pee pale yellow. This information is not intended to replace advice given to you by your health care provider. Make sure you discuss any questions you have with your health care provider. Document Revised: 06/29/2020 Document Reviewed: 06/29/2020 Elsevier Patient Education  2021 [employer]  

## 2021-03-08 ENCOUNTER — Other Ambulatory Visit: Payer: Self-pay

## 2021-04-24 ENCOUNTER — Ambulatory Visit: Payer: 59 | Admitting: Nurse Practitioner

## 2021-05-02 ENCOUNTER — Ambulatory Visit: Payer: 59 | Admitting: Nurse Practitioner

## 2021-05-14 ENCOUNTER — Other Ambulatory Visit: Payer: Self-pay

## 2021-05-14 ENCOUNTER — Encounter: Payer: Self-pay | Admitting: Nurse Practitioner

## 2021-05-14 ENCOUNTER — Ambulatory Visit: Payer: 59 | Admitting: Nurse Practitioner

## 2021-05-14 VITALS — BP 125/76 | HR 53 | Temp 97.7°F | Ht 65.0 in | Wt 184.3 lb

## 2021-05-14 DIAGNOSIS — I10 Essential (primary) hypertension: Secondary | ICD-10-CM

## 2021-05-14 DIAGNOSIS — R300 Dysuria: Secondary | ICD-10-CM

## 2021-05-14 DIAGNOSIS — F5101 Primary insomnia: Secondary | ICD-10-CM

## 2021-05-14 DIAGNOSIS — R3129 Other microscopic hematuria: Secondary | ICD-10-CM

## 2021-05-14 DIAGNOSIS — F411 Generalized anxiety disorder: Secondary | ICD-10-CM

## 2021-05-14 DIAGNOSIS — E039 Hypothyroidism, unspecified: Secondary | ICD-10-CM | POA: Diagnosis not present

## 2021-05-14 DIAGNOSIS — N390 Urinary tract infection, site not specified: Secondary | ICD-10-CM | POA: Diagnosis not present

## 2021-05-14 LAB — POCT URINALYSIS DIPSTICK
Bilirubin, UA: NEGATIVE
Glucose, UA: NEGATIVE
Ketones, UA: NEGATIVE
Leukocytes, UA: NEGATIVE
Nitrite, UA: NEGATIVE
Protein, UA: NEGATIVE
Spec Grav, UA: 1.02 (ref 1.010–1.025)
Urobilinogen, UA: 0.2 E.U./dL
pH, UA: 7 (ref 5.0–8.0)

## 2021-05-14 MED ORDER — BUPROPION HCL ER (XL) 300 MG PO TB24: 300.0000 mg | ORAL_TABLET | ORAL | 1 refills | Status: DC

## 2021-05-14 MED ORDER — NITROFURANTOIN MONOHYD MACRO 100 MG PO CAPS: ORAL_CAPSULE | ORAL | 1 refills | Status: DC

## 2021-05-14 MED ORDER — BISOPROLOL FUMARATE 10 MG PO TABS: 10.0000 mg | ORAL_TABLET | Freq: Every day | ORAL | 1 refills | Status: DC

## 2021-05-14 NOTE — Progress Notes (Signed)
Established Patient Office Visit  Subjective:  Patient ID: Grace Christensen, female    DOB: [birth year]  Age: 60 y.o. MRN: [id number]  CC:  Chief Complaint  Patient presents with   Follow-up    HPI Grace Christensen presents for recurrent urinary tract infection. She has been on macrobid daily for past three months to treat past infection and to prevent further infection. She currently denies dysuria, urinary frequency, flank pain, or abdominal pain. She states that she notices that her urine is dark on some days. Urine sample today is positive for small amount of blood with no evidence of infection. Will continue macrobid preventive treatment for another three months and reassess urine sample at her next visit.  She states that she is doing well. She denies chest pain, chest pressure, or shortness of breath. She denies headaches or visual disturbances. She denies abdominal pain, nausea, vomiting, or changes in bowel or bladder habits.   She does need to have a recheck of her thyroid panel. In 01/2021, her TSH was abnormal.   Past Medical History:  Diagnosis Date   Anemia    H/O YEARS AGO   Cancer ([place])    SKIN CANCER ON FACE   GERD (gastroesophageal reflux disease)    Hypertension    Hypothyroidism    Thyroid disease    Phreesia 01/21/2021    Past Surgical History:  Procedure Laterality Date   COLONOSCOPY     FUNCTIONAL ENDOSCOPIC SINUS SURGERY     KNEE ARTHROSCOPY Right 03/04/2017   Procedure: ARTHROSCOPY KNEE, REMOVAL OF LOOSE BODY. CHONDROPLASTY MEDIAL COMPARTMENT;  Surgeon: Dereck Leep, MD;  Location: [facility] ORS;  Service: Orthopedics;  Laterality: Right;   NOVASURE ABLATION     TUBAL LIGATION      Family History  Problem Relation Age of Onset   Cancer Mother    Hypertension Mother    Prostate cancer Father     Social History   Socioeconomic History   Marital status: Married    Spouse name: Not on file   Number of children: Not on file   Years of education: Not on  file   Highest education level: Not on file  Occupational History   Occupation: Arboriculturist    Employer: [employer]  Tobacco Use   Smoking status: Former    Packs/day: 1.00    Years: 15.00    Pack years: 15.00    Types: Cigarettes    Quit date: 07/27/2009    Years since quitting: 11.8   Smokeless tobacco: Never  Substance and Sexual Activity   Alcohol use: Yes    Comment: [facility] [employer]EEK[employer]    Drug use: No   Sexual activity: Yes  Other Topics Concern   Not on file  Social History Narrative   Not on file   Social Determinants of Health   Financial Resource Strain: Not on file  Food Insecurity: Not on file  Transportation Needs: Not on file  Physical Activity: Not on file  Stress: Not on file  Social Connections: Not on file  Intimate Partner Violence: Not on file    Outpatient Medications Prior to Visit  Medication Sig Dispense Refill   ALPRAZolam (XANAX) 0.25 MG tablet Take 1 tablet (0.25 mg total) by mouth 2 (two) times daily as needed for anxiety. 60 tablet 2   aspirin EC 81 MG tablet Take 81 mg by mouth daily.     B Complex Vitamins (B COMPLEX PO) Place 1 mL under the tongue daily.  Biotin 1000 MCG tablet Take 1,000 mcg by mouth daily.     Calcium Carb-Cholecalciferol 600-800 MG-UNIT TABS Take 1 tablet by mouth daily.     Cholecalciferol (VITAMIN D) 2000 units tablet Take 2,000 Units by mouth daily.     fexofenadine (ALLEGRA) 180 MG tablet Take 180 mg by mouth every morning. Allergy Relief     Omega-3 Fatty Acids (OMEGA-3 FISH OIL) 1200 MG CAPS Take 1-2 capsules by mouth See admin instructions. 1 capsule in the morning, and 2 capsules in the evening     Potassium 99 MG TABS Take 1 tablet by mouth daily.     sertraline (ZOLOFT) 100 MG tablet Take 2 tablets (200 mg total) by mouth every morning. 180 tablet 1   traZODone (DESYREL) 100 MG tablet TAKE 3 TABLETS BY MOUTH AT  BEDTIME AS NEEDED (Patient taking differently: TAKE 3 TABLETS BY MOUTH AT  BEDTIME AS NEEDED) 270 tablet 3    vitamin B-12 (CYANOCOBALAMIN) 1000 MCG tablet Take 1,000 mcg by mouth daily.     bisoprolol (ZEBETA) 10 MG tablet Take 1 tablet (10 mg total) by mouth at bedtime. 90 tablet 1   buPROPion (WELLBUTRIN XL) 300 MG 24 hr tablet Take 1 tablet (300 mg total) by mouth every morning. 90 tablet 1   levothyroxine (SYNTHROID) 75 MCG tablet Take 1 tablet (75 mcg total) by mouth daily. 90 tablet 0   nitrofurantoin, macrocrystal-monohydrate, (MACROBID) 100 MG capsule Take 1 capsule po BID for 7 days then take 1 capsule po daily for next 3 months to prevent further UTI 37 capsule 2   sulfamethoxazole-trimethoprim (BACTRIM DS) 800-160 MG tablet Take 1 tablet by mouth 2 (two) times daily. 14 tablet 0   No facility-administered medications prior to visit.    Allergies  Allergen Reactions   Other     ROS Review of Systems  Constitutional:  Negative for activity change, chills, fatigue and fever.  HENT:  Negative for congestion, postnasal drip, rhinorrhea, sinus pressure and sinus pain.   Eyes: Negative.   Respiratory:  Negative for cough, chest tightness and wheezing.   Cardiovascular:  Negative for chest pain and palpitations.  Gastrointestinal:  Negative for constipation, diarrhea, nausea and vomiting.  Endocrine: Negative for cold intolerance, heat intolerance, polydipsia and polyuria.       Needs to have recheck of thyroid panel  Genitourinary:  Negative for dysuria, flank pain, frequency, pelvic pain and urgency.  Musculoskeletal:  Negative for back pain and myalgias.  Skin:  Negative for rash.  Allergic/Immunologic: Negative.   Neurological:  Negative for dizziness, weakness and headaches.  Psychiatric/Behavioral:  Positive for dysphoric mood and sleep disturbance. The patient is nervous/anxious.        Well managed with current symptoms.      Objective:    Physical Exam Vitals and nursing note reviewed.  Constitutional:      Appearance: Normal appearance. She is well-developed.  HENT:      Head: Normocephalic and atraumatic.     Nose: Nose normal.     Mouth/Throat:     Mouth: Mucous membranes are moist.  Eyes:     Extraocular Movements: Extraocular movements intact.     Conjunctiva/sclera: Conjunctivae normal.     Pupils: Pupils are equal, round, and reactive to light.  Cardiovascular:     Rate and Rhythm: Normal rate and regular rhythm.     Pulses: Normal pulses.     Heart sounds: Normal heart sounds.  Pulmonary:     Effort: Pulmonary effort  is normal.     Breath sounds: Normal breath sounds.  Abdominal:     General: Bowel sounds are normal.     Palpations: Abdomen is soft.     Tenderness: There is no abdominal tenderness.  Genitourinary:    Comments: Urine sample is positive for small blood only.  Musculoskeletal:        General: Normal range of motion.     Cervical back: Normal range of motion and neck supple.  Lymphadenopathy:     Cervical: No cervical adenopathy.  Skin:    General: Skin is warm and dry.     Capillary Refill: Capillary refill takes less than 2 seconds.  Neurological:     General: No focal deficit present.     Mental Status: She is alert and oriented to person, place, and time.  Psychiatric:        Mood and Affect: Mood normal.        Behavior: Behavior normal.        Thought Content: Thought content normal.        Judgment: Judgment normal.   Today's Vitals   05/14/21 1047  BP: 125/76  Pulse: (!) 53  Temp: 97.7 F (36.5 C)  SpO2: 96%  Weight: 184 lb 4.8 oz (83.6 kg)   Body mass index is 30.67 kg/m.   Wt Readings from Last 3 Encounters:  05/14/21 184 lb 4.8 oz (83.6 kg)  03/06/21 184 lb (83.5 kg)  01/24/21 186 lb 9.6 oz (84.6 kg)     Health Maintenance Due  Topic Date Due   HIV Screening  Never done   TETANUS/TDAP  Never done   Zoster Vaccines- Shingrix (1 of 2) Never done   COVID-19 Vaccine (4 - Booster for Pfizer series) 03/10/2021    There are no preventive care reminders to display for this patient.  Lab  Results  Component Value Date   TSH 1.250 05/14/2021   Lab Results  Component Value Date   WBC 5.1 01/03/2021   HGB 14.9 01/03/2021   HCT 43.4 01/03/2021   MCV 90 01/03/2021   PLT 146 (L) 01/03/2021   Lab Results  Component Value Date   NA 137 01/03/2021   K 4.0 01/03/2021   CO2 26 01/03/2021   GLUCOSE 71 01/03/2021   BUN 9 01/03/2021   CREATININE 0.60 01/03/2021   BILITOT 0.4 01/03/2021   ALKPHOS 75 01/03/2021   AST 15 01/03/2021   ALT 13 01/03/2021   PROT 7.2 01/03/2021   ALBUMIN 4.4 01/03/2021   CALCIUM 9.5 01/03/2021   Lab Results  Component Value Date   CHOL 215 (H) 01/03/2021   Lab Results  Component Value Date   HDL 49 01/03/2021   Lab Results  Component Value Date   LDLCALC 140 (H) 01/03/2021   Lab Results  Component Value Date   TRIG 146 01/03/2021   No results found for: CHOLHDL No results found for: HGBA1C    Assessment & Plan:  1. Acquired hypothyroidism Check thyroid panel today and adjust dose of levothyroxine as indicated  - TSH + free T4   2. Hypertension, unspecified type Stable. Continue bp medication as prescribed  - bisoprolol (ZEBETA) 10 MG tablet; Take 1 tablet (10 mg total) by mouth at bedtime.  Dispense: 90 tablet; Refill: 1  3. Recurrent urinary tract infection Will continue macrobid 100mg  daily to prevent recurrent urinary tract infections. Will retest urine at next visit for further evaluation.  - nitrofurantoin, macrocrystal-monohydrate, (MACROBID) 100 MG capsule; Take  1 capsule po qd for recurrent urinary tract infection.  Dispense: 90 capsule; Refill: 1 - POCT Urinalysis Dipstick  4. Dysuria Urine showing small blood only. Continue macrobid 100mg  daily for next 3 months. Recheck at next visit for further evaluation.  - POCT Urinalysis Dipstick  5. Other microscopic hematuria Persistent hematuria. Will get renal ultrasound for further evaluation.  - US Renal; Future  6. Primary insomnia Stable. Continue trazodone as  prescribed   7. Generalized anxiety disorder Stable. Continue bupropion XL daily. Refills provided today. Will refill alprazolam as needed.  - buPROPion (WELLBUTRIN XL) 300 MG 24 hr tablet; Take 1 tablet (300 mg total) by mouth every morning.  Dispense: 90 tablet; Refill: 1   Problem List Items Addressed This Visit       Cardiovascular and Mediastinum   Hypertension   Relevant Medications   bisoprolol (ZEBETA) 10 MG tablet     Endocrine   Acquired hypothyroidism - Primary   Relevant Medications   bisoprolol (ZEBETA) 10 MG tablet   Other Relevant Orders   TSH + free T4 (Completed)   POCT Urinalysis Dipstick (Completed)     Genitourinary   Recurrent urinary tract infection   Relevant Medications   nitrofurantoin, macrocrystal-monohydrate, (MACROBID) 100 MG capsule   Other microscopic hematuria   Relevant Orders   US Renal     Other   Generalized anxiety disorder   Relevant Medications   buPROPion (WELLBUTRIN XL) 300 MG 24 hr tablet   Primary insomnia   Dysuria   Relevant Orders   POCT Urinalysis Dipstick (Completed)    Meds ordered this encounter  Medications   bisoprolol (ZEBETA) 10 MG tablet    Sig: Take 1 tablet (10 mg total) by mouth at bedtime.    Dispense:  90 tablet    Refill:  1    Order Specific Question:   Supervising Provider    Answer:   Beatrice Lecher D [[id number]]   buPROPion (WELLBUTRIN XL) 300 MG 24 hr tablet    Sig: Take 1 tablet (300 mg total) by mouth every morning.    Dispense:  90 tablet    Refill:  1    Order Specific Question:   Supervising Provider    Answer:   Beatrice Lecher D [[id number]]   nitrofurantoin, macrocrystal-monohydrate, (MACROBID) 100 MG capsule    Sig: Take 1 capsule po qd for recurrent urinary tract infection.    Dispense:  90 capsule    Refill:  1    Order Specific Question:   Supervising Provider    Answer:   Beatrice Lecher D [[id number]]     Follow-up: Return in about 3 months (around 08/14/2021) for mood. check  urine dip for persistent uti.    Ronnell Freshwater, NP

## 2021-05-15 ENCOUNTER — Other Ambulatory Visit: Payer: Self-pay | Admitting: Nurse Practitioner

## 2021-05-15 DIAGNOSIS — E039 Hypothyroidism, unspecified: Secondary | ICD-10-CM

## 2021-05-15 LAB — TSH+FREE T4
Free T4: 1.37 ng/dL (ref 0.82–1.77)
TSH: 1.25 u[IU]/mL (ref 0.450–4.500)

## 2021-05-15 MED ORDER — LEVOTHYROXINE SODIUM 75 MCG PO TABS: 75.0000 ug | ORAL_TABLET | Freq: Every day | ORAL | 1 refills | Status: DC

## 2021-05-15 NOTE — Progress Notes (Signed)
Please let the patient know that her thyroid panel looks good. I have renewed levothyroxine at 75 mcg daily and sent new prescription to [facility].

## 2021-05-15 NOTE — Progress Notes (Signed)
Thyroid panel looks good. I have renewed levothyroxine at 75 mcg daily and sent new prescription to [facility].

## 2021-05-25 DIAGNOSIS — N390 Urinary tract infection, site not specified: Secondary | ICD-10-CM | POA: Insufficient documentation

## 2021-05-25 DIAGNOSIS — R3129 Other microscopic hematuria: Secondary | ICD-10-CM | POA: Insufficient documentation

## 2021-05-27 ENCOUNTER — Other Ambulatory Visit: Payer: Self-pay | Admitting: Internal Medicine

## 2021-05-27 DIAGNOSIS — F411 Generalized anxiety disorder: Secondary | ICD-10-CM

## 2021-05-30 ENCOUNTER — Other Ambulatory Visit: Payer: Self-pay | Admitting: Internal Medicine

## 2021-05-30 DIAGNOSIS — F411 Generalized anxiety disorder: Secondary | ICD-10-CM

## 2021-06-03 ENCOUNTER — Other Ambulatory Visit: Payer: Self-pay | Admitting: Nurse Practitioner

## 2021-06-03 DIAGNOSIS — N390 Urinary tract infection, site not specified: Secondary | ICD-10-CM

## 2021-07-12 ENCOUNTER — Other Ambulatory Visit: Payer: Self-pay | Admitting: Internal Medicine

## 2021-07-12 DIAGNOSIS — F411 Generalized anxiety disorder: Secondary | ICD-10-CM

## 2021-08-14 ENCOUNTER — Other Ambulatory Visit: Payer: Self-pay

## 2021-08-14 ENCOUNTER — Ambulatory Visit: Payer: 59 | Admitting: Nurse Practitioner

## 2021-08-14 ENCOUNTER — Encounter: Payer: Self-pay | Admitting: Nurse Practitioner

## 2021-08-14 VITALS — BP 105/62 | HR 64 | Temp 98.2°F | Ht 65.0 in | Wt 180.9 lb

## 2021-08-14 DIAGNOSIS — R5383 Other fatigue: Secondary | ICD-10-CM

## 2021-08-14 DIAGNOSIS — Z6830 Body mass index (BMI) 30.0-30.9, adult: Secondary | ICD-10-CM

## 2021-08-14 DIAGNOSIS — Z23 Encounter for immunization: Secondary | ICD-10-CM | POA: Diagnosis not present

## 2021-08-14 DIAGNOSIS — R319 Hematuria, unspecified: Secondary | ICD-10-CM

## 2021-08-14 DIAGNOSIS — N390 Urinary tract infection, site not specified: Secondary | ICD-10-CM

## 2021-08-14 DIAGNOSIS — Z1231 Encounter for screening mammogram for malignant neoplasm of breast: Secondary | ICD-10-CM

## 2021-08-14 DIAGNOSIS — E039 Hypothyroidism, unspecified: Secondary | ICD-10-CM

## 2021-08-14 LAB — POCT URINALYSIS DIP (CLINITEK)
Bilirubin, UA: NEGATIVE
Glucose, UA: NEGATIVE mg/dL
Leukocytes, UA: NEGATIVE
Nitrite, UA: NEGATIVE
Spec Grav, UA: 1.015 (ref 1.010–1.025)
Urobilinogen, UA: 0.2 E.U./dL
pH, UA: 7.5 (ref 5.0–8.0)

## 2021-08-14 NOTE — Progress Notes (Signed)
Established Patient Office Visit  Subjective:  Patient ID: Grace Christensen, female    DOB: [birth year]  Age: 60 y.o. MRN: [id number]  CC:  Chief Complaint  Patient presents with   Hypothyroidism   Urinary Tract Infection    Chronic - on macrobid daily to prevent frequent and persistent infection      HPI Grace Christensen presents for follow-up visit.  She was started on Macrobid daily due to persistent urinary tract infection with hematuria.  She states she is doing well with this antibiotic.  She has no negative side effects to report.  She has had no signs or symptoms of urinary tract infection since being on antibiotic daily.  Urinalysis today is still positive for moderate blood.  It is otherwise negative for infection or abnormalities. She is due to have a recheck of thyroid panel.  Most recent panel checked in June and it was normal.  No changes were made to her dose of levothyroxine. Patient states she is doing well with sertraline 200 mg daily to treat depression and anxiety.  She does take trazodone on as-needed basis to help with sleep.  This does help. Patient recently started a weight loss program through [facility].  They have asked to have a random insulin test done to further evaluate patient.  We will get that today with draw for thyroid panel. Patient has no new concerns or complaints today. denies chest pain, chest pressure, or shortness of breath. She denies headaches or visual disturbances. She denies abdominal pain, nausea, vomiting, or changes in bowel or bladder habits.    Past Medical History:  Diagnosis Date   Anemia    H/O YEARS AGO   Cancer ([place])    SKIN CANCER ON FACE   GERD (gastroesophageal reflux disease)    Hypertension    Hypothyroidism    Thyroid disease    Phreesia 01/21/2021    Past Surgical History:  Procedure Laterality Date   COLONOSCOPY     FUNCTIONAL ENDOSCOPIC SINUS SURGERY     KNEE ARTHROSCOPY Right 03/04/2017   Procedure:  ARTHROSCOPY KNEE, REMOVAL OF LOOSE BODY. CHONDROPLASTY MEDIAL COMPARTMENT;  Surgeon: Dereck Leep, MD;  Location: [facility] ORS;  Service: Orthopedics;  Laterality: Right;   NOVASURE ABLATION     TUBAL LIGATION      Family History  Problem Relation Age of Onset   Cancer Mother    Hypertension Mother    Prostate cancer Father     Social History   Socioeconomic History   Marital status: Married    Spouse name: Not on file   Number of children: Not on file   Years of education: Not on file   Highest education level: Not on file  Occupational History   Occupation: Arboriculturist    Employer: [employer]  Tobacco Use   Smoking status: Former    Packs/day: 1.00    Years: 15.00    Pack years: 15.00    Types: Cigarettes    Quit date: 07/27/2009    Years since quitting: 12.0   Smokeless tobacco: Never  Substance and Sexual Activity   Alcohol use: Yes    Comment: [employer]    Drug use: No   Sexual activity: Yes  Other Topics Concern   Not on file  Social History Narrative   Not on file   Social Determinants of Health   Financial Resource Strain: Not on file  Food Insecurity: Not on file  Transportation Needs: Not on file  Physical Activity: Not on file  Stress: Not on file  Social Connections: Not on file  Intimate Partner Violence: Not on file    Outpatient Medications Prior to Visit  Medication Sig Dispense Refill   ALPRAZolam (XANAX) 0.25 MG tablet Take 1 tablet (0.25 mg total) by mouth 2 (two) times daily as needed for anxiety. 60 tablet 2   aspirin EC 81 MG tablet Take 81 mg by mouth daily.     B Complex Vitamins (B COMPLEX PO) Place 1 mL under the tongue daily.     Biotin 1000 MCG tablet Take 1,000 mcg by mouth daily.     bisoprolol (ZEBETA) 10 MG tablet Take 1 tablet (10 mg total) by mouth at bedtime. 90 tablet 1   buPROPion (WELLBUTRIN XL) 300 MG 24 hr tablet Take 1 tablet (300 mg total) by mouth every morning. 90 tablet 1   Calcium Carb-Cholecalciferol 600-800  MG-UNIT TABS Take 1 tablet by mouth daily.     Cholecalciferol (VITAMIN D) 2000 units tablet Take 2,000 Units by mouth daily.     fexofenadine (ALLEGRA) 180 MG tablet Take 180 mg by mouth every morning. Allergy Relief     levothyroxine (SYNTHROID) 75 MCG tablet Take 1 tablet (75 mcg total) by mouth daily. 90 tablet 1   nitrofurantoin, macrocrystal-monohydrate, (MACROBID) 100 MG capsule Take 1 capsule po qd for recurrent urinary tract infection. 90 capsule 1   Omega-3 Fatty Acids (OMEGA-3 FISH OIL) 1200 MG CAPS Take 1-2 capsules by mouth See admin instructions. 1 capsule in the morning, and 2 capsules in the evening     Potassium 99 MG TABS Take 1 tablet by mouth daily.     sertraline (ZOLOFT) 100 MG tablet Take 2 tablets (200 mg total) by mouth every morning. 180 tablet 1   traZODone (DESYREL) 100 MG tablet TAKE 3 TABLETS BY MOUTH AT  BEDTIME AS NEEDED (Patient taking differently: TAKE 3 TABLETS BY MOUTH AT  BEDTIME AS NEEDED) 270 tablet 3   vitamin B-12 (CYANOCOBALAMIN) 1000 MCG tablet Take 1,000 mcg by mouth daily.     No facility-administered medications prior to visit.    Allergies  Allergen Reactions   Other     ROS Review of Systems  Constitutional:  Negative for appetite change, chills, diaphoresis, fatigue and fever.  HENT:  Negative for congestion, postnasal drip, rhinorrhea, sinus pressure, sinus pain, sneezing and sore throat.   Eyes: Negative.   Respiratory:  Negative for cough, chest tightness, shortness of breath and wheezing.   Cardiovascular:  Negative for chest pain and palpitations.  Gastrointestinal:  Negative for abdominal pain, constipation, diarrhea, nausea and vomiting.  Endocrine: Negative for cold intolerance, heat intolerance, polydipsia and polyuria.       She is due to have a check of thyroid panel today.  Genitourinary:  Positive for hematuria. Negative for dyspareunia, dysuria, flank pain, frequency and urgency.       Chronic urinary tract infection with  hematuria.  Musculoskeletal:  Negative for arthralgias, back pain and myalgias.  Skin:  Negative for rash.  Allergic/Immunologic: Negative for environmental allergies.  Neurological:  Negative for dizziness, weakness and headaches.  Hematological:  Negative for adenopathy.  Psychiatric/Behavioral:  Positive for dysphoric mood. The patient is nervous/anxious.      Objective:    Physical Exam Vitals and nursing note reviewed.  Constitutional:      Appearance: Normal appearance. She is well-developed.  HENT:     Head: Normocephalic and atraumatic.     Nose: Nose  normal.     Mouth/Throat:     Mouth: Mucous membranes are moist.  Eyes:     Extraocular Movements: Extraocular movements intact.     Conjunctiva/sclera: Conjunctivae normal.     Pupils: Pupils are equal, round, and reactive to light.  Cardiovascular:     Rate and Rhythm: Normal rate and regular rhythm.     Pulses: Normal pulses.     Heart sounds: Normal heart sounds.  Pulmonary:     Effort: Pulmonary effort is normal.     Breath sounds: Normal breath sounds.  Abdominal:     General: Bowel sounds are normal.     Palpations: Abdomen is soft.     Tenderness: There is no guarding.  Genitourinary:    Comments: Urine sample is positive for moderate blood and trace protein.  And is otherwise negative for infection or acute abnormalities. Musculoskeletal:        General: Normal range of motion.     Cervical back: Normal range of motion and neck supple.  Lymphadenopathy:     Cervical: No cervical adenopathy.  Skin:    General: Skin is warm and dry.     Capillary Refill: Capillary refill takes less than 2 seconds.  Neurological:     General: No focal deficit present.     Mental Status: She is alert and oriented to person, place, and time.  Psychiatric:        Mood and Affect: Mood normal.        Behavior: Behavior normal.        Thought Content: Thought content normal.        Judgment: Judgment normal.    Today's  Vitals   08/14/21 1042  BP: 105/62  Pulse: 64  Temp: 98.2 F (36.8 C)  SpO2: 97%  Weight: 180 lb 14.4 oz (82.1 kg)  Height: '5\' 5"'$  (1.651 m)   Body mass index is 30.1 kg/m.   Wt Readings from Last 3 Encounters:  08/14/21 180 lb 14.4 oz (82.1 kg)  05/14/21 184 lb 4.8 oz (83.6 kg)  03/06/21 184 lb (83.5 kg)     Health Maintenance Due  Topic Date Due   HIV Screening  Never done   TETANUS/TDAP  Never done   Zoster Vaccines- Shingrix (1 of 2) Never done   COVID-19 Vaccine (4 - Booster for Pfizer series) 03/10/2021    There are no preventive care reminders to display for this patient.  Lab Results  Component Value Date   TSH 1.250 05/14/2021   Lab Results  Component Value Date   WBC 5.1 01/03/2021   HGB 14.9 01/03/2021   HCT 43.4 01/03/2021   MCV 90 01/03/2021   PLT 146 (L) 01/03/2021   Lab Results  Component Value Date   NA 137 01/03/2021   K 4.0 01/03/2021   CO2 26 01/03/2021   GLUCOSE 71 01/03/2021   BUN 9 01/03/2021   CREATININE 0.60 01/03/2021   BILITOT 0.4 01/03/2021   ALKPHOS 75 01/03/2021   AST 15 01/03/2021   ALT 13 01/03/2021   PROT 7.2 01/03/2021   ALBUMIN 4.4 01/03/2021   CALCIUM 9.5 01/03/2021   Lab Results  Component Value Date   CHOL 215 (H) 01/03/2021   Lab Results  Component Value Date   HDL 49 01/03/2021   Lab Results  Component Value Date   LDLCALC 140 (H) 01/03/2021   Lab Results  Component Value Date   TRIG 146 01/03/2021   No results found for: CHOLHDL No  results found for: HGBA1C    Assessment & Plan:  1. Urinary tract infection with hematuria, site unspecified Urine specimen positive for moderate blood and trace protein.  We will recommend she get renal ultrasound which has been previously ordered.  Continue Macrobid daily.  BMP and CBC checked today.  Will notify patient of results when they are available. - POCT URINALYSIS DIP (CLINITEK)  2. Acquired hypothyroidism TSH and free T4 drawn during today's visit.  Will  notify patient of results when they are available.  Adjust levothyroxine dosing as indicated. - TSH + free T4  3. Other fatigue Will check CBC and BMP today for further evaluation. - Basic Metabolic Panel (BMET) - CBC  4. Body mass index (BMI) of 30.0-30.9 in adult Currently taking part and medically managed weight loss program at [facility].  We will get random insulin level per request.  In meantime patient should limit calorie intake to 1500 cal or less per day.  She should consume a low-fat, low-cholesterol diet and incorporate exercise into her daily routine. - Insulin, random  5. Encounter for screening mammogram for malignant neoplasm of breast An order for screening mammogram was placed during today's visit. - MM DIGITAL SCREENING BILATERAL; Future  6. Need for influenza vaccination Flu vaccine was administered during today's visit. - Flu Vaccine QUAD 6+ mos PF IM (Fluarix Quad PF)   Problem List Items Addressed This Visit       Endocrine   Acquired hypothyroidism   Relevant Orders   TSH + free T4     Genitourinary   Urinary tract infection with hematuria - Primary   Relevant Orders   POCT URINALYSIS DIP (CLINITEK) (Completed)     Other   Screening for breast cancer   Relevant Orders   MM DIGITAL SCREENING BILATERAL   Body mass index (BMI) of 30.0-30.9 in adult   Relevant Orders   Insulin, random   Other fatigue   Relevant Orders   Basic Metabolic Panel (BMET)   CBC   Need for influenza vaccination   Relevant Orders   Flu Vaccine QUAD 6+ mos PF IM (Fluarix Quad PF) (Completed)    This note was dictated using Systems analyst. Rapid proofreading was performed to expedite the delivery of the information. Despite proofreading, phonetic errors will occur which are common with this voice recognition software. Please take this into consideration. If there are any concerns, please contact our office.     Follow-up: Return in about 3 months  (around 11/13/2021) for health maintenance exam.    Ronnell Freshwater, NP

## 2021-08-14 NOTE — Patient Instructions (Signed)

## 2021-08-15 ENCOUNTER — Encounter: Payer: Self-pay | Admitting: Nurse Practitioner

## 2021-08-15 LAB — BASIC METABOLIC PANEL
BUN/Creatinine Ratio: 13 (ref 12–28)
BUN: 8 mg/dL (ref 8–27)
CO2: 29 mmol/L (ref 20–29)
Calcium: 10.2 mg/dL (ref 8.7–10.3)
Chloride: 99 mmol/L (ref 96–106)
Creatinine, Ser: 0.63 mg/dL (ref 0.57–1.00)
Glucose: 78 mg/dL (ref 65–99)
Potassium: 4.7 mmol/L (ref 3.5–5.2)
Sodium: 139 mmol/L (ref 134–144)
eGFR: 101 mL/min/{1.73_m2} (ref >59)

## 2021-08-15 LAB — CBC
Hematocrit: 44.6 % (ref 34.0–46.6)
Hemoglobin: 15 g/dL (ref 11.1–15.9)
MCH: 30.6 pg (ref 26.6–33.0)
MCHC: 33.6 g/dL (ref 31.5–35.7)
MCV: 91 fL (ref 79–97)
Platelets: 170 10*3/uL (ref 150–450)
RBC: 4.9 x10E6/uL (ref 3.77–5.28)
RDW: 12.4 % (ref 11.7–15.4)
WBC: 6.4 10*3/uL (ref 3.4–10.8)

## 2021-08-15 LAB — INSULIN, RANDOM: INSULIN: 24.1 u[IU]/mL (ref 2.6–24.9)

## 2021-08-15 LAB — TSH+FREE T4
Free T4: 1.33 ng/dL (ref 0.82–1.77)
TSH: 1.15 u[IU]/mL (ref 0.450–4.500)

## 2021-08-15 NOTE — Progress Notes (Signed)
Thyroid normal. Continue current dose levothyroxine. Will continue macrobid, however, urine positive for moderate blood. Would like to get ultrasound of kidneys and bladder and will place new order if she is agreeable. MyChart message sent to patient.

## 2021-08-20 ENCOUNTER — Telehealth: Payer: Self-pay | Admitting: Nurse Practitioner

## 2021-08-20 ENCOUNTER — Other Ambulatory Visit: Payer: Self-pay | Admitting: Nurse Practitioner

## 2021-08-20 DIAGNOSIS — F411 Generalized anxiety disorder: Secondary | ICD-10-CM

## 2021-08-20 MED ORDER — ALPRAZOLAM 0.25 MG PO TABS: 0.2500 mg | ORAL_TABLET | Freq: Two times a day (BID) | ORAL | 2 refills | Status: DC | PRN

## 2021-08-20 NOTE — Progress Notes (Signed)
Refill prescription for alprazolam 0.25 mg tablets up to twice daily as needed for anxiety.  Sent to [facility] in [place], next to [facility].

## 2021-08-20 NOTE — Telephone Encounter (Signed)
Patient needs referral put in for her mammogram at the below location and also needs a refill on her Xanax.    Contact Information [facility] [address] [place], [place] [address] Get Driving Directions  Main ZHQUI:[phone] Fax: [phone]   ALPRAZolam Duanne Moron) 0.25 MG tablet [[id number]]    Order Details Dose: 0.25 mg Route: Oral Frequency: 2 times daily PRN for anxiety  Dispense Quantity: 60 tablet Refills: 2   Indications of Use: Anxiety       Sig: Take 1 tablet (0.25 mg total) by mouth 2 (two) times daily as needed for anxiety.       Start Date: 01/24/21 End Date: --  Written Date: 01/24/21 Expiration Date: 07/23/21     Diagnosis Association: Generalized anxiety disorder (F41.1)

## 2021-08-20 NOTE — Telephone Encounter (Signed)
Hey. I just sent Refill for prescription for alprazolam 0.25 mg tablets up to twice daily as needed for anxiety.  Sent to [facility] in [place], next to [facility]. I put order and referral in for her mammogram during her visit. I put [facility] [place]. I'm not sure how to check on the status of that order.

## 2021-08-20 NOTE — Telephone Encounter (Signed)
Called pt she aware of her Rx that was sent in to [facility] in [place] and the referral that was placed for her mammogram

## 2021-09-12 ENCOUNTER — Other Ambulatory Visit: Payer: Self-pay | Admitting: Nurse Practitioner

## 2021-09-12 DIAGNOSIS — E039 Hypothyroidism, unspecified: Secondary | ICD-10-CM

## 2021-09-19 ENCOUNTER — Encounter: Payer: Self-pay | Admitting: Nurse Practitioner

## 2021-09-19 ENCOUNTER — Ambulatory Visit: Payer: 59 | Admitting: Nurse Practitioner

## 2021-09-19 ENCOUNTER — Other Ambulatory Visit: Payer: Self-pay

## 2021-09-19 VITALS — BP 104/67 | HR 68 | Temp 98.0°F | Ht 65.0 in | Wt 182.0 lb

## 2021-09-19 DIAGNOSIS — M7981 Nontraumatic hematoma of soft tissue: Secondary | ICD-10-CM

## 2021-09-19 DIAGNOSIS — T148XXA Other injury of unspecified body region, initial encounter: Secondary | ICD-10-CM

## 2021-09-19 NOTE — Progress Notes (Signed)
Acute Office Visit  Subjective:    Patient ID: Grace Christensen, female    DOB: 02-24-1961, 60 y.o.   MRN: [id number]  Chief Complaint  Patient presents with   Bleeding/Bruising     The patient states that on 08/25/2021, she fell at home. She landed on right side, fracturing her collar bone. She developed bruising under the right arm and on the right breast. Has not improved much though it has been nearly one month. She noted a new bruise yesterday on left buttock which is new. She does not remember an injury to correlate with the bruise on the left buttock. There is dark and large bruise on the back of the right upper leg, and bruising to the left outer thigh. There is a bruise present on the right shin. She is seeing weigt loss clinic. She is now taking Vitamin B12 with Lipotropic protein to help with weight management. She denies other concerns or complaints today.     Past Medical History:  Diagnosis Date   Anemia    H/O YEARS AGO   Cancer ([place])    SKIN CANCER ON FACE   GERD (gastroesophageal reflux disease)    Hypertension    Hypothyroidism    Thyroid disease    Phreesia 01/21/2021    Past Surgical History:  Procedure Laterality Date   COLONOSCOPY     FUNCTIONAL ENDOSCOPIC SINUS SURGERY     KNEE ARTHROSCOPY Right 03/04/2017   Procedure: ARTHROSCOPY KNEE, REMOVAL OF LOOSE BODY. CHONDROPLASTY MEDIAL COMPARTMENT;  Surgeon: Dereck Leep, MD;  Location: [facility] ORS;  Service: Orthopedics;  Laterality: Right;   NOVASURE ABLATION     TUBAL LIGATION      Family History  Problem Relation Age of Onset   Cancer Mother    Hypertension Mother    Prostate cancer Father     Social History   Socioeconomic History   Marital status: Married    Spouse name: Not on file   Number of children: Not on file   Years of education: Not on file   Highest education level: Not on file  Occupational History   Occupation: Arboriculturist    Employer: [employer]  Tobacco Use   Smoking status:  Former    Packs/day: 1.00    Years: 15.00    Pack years: 15.00    Types: Cigarettes    Quit date: 07/27/2009    Years since quitting: 12.1   Smokeless tobacco: Never  Substance and Sexual Activity   Alcohol use: Yes    Comment: OCC WEEKEND    Drug use: No   Sexual activity: Yes  Other Topics Concern   Not on file  Social History Narrative   Not on file   Social Determinants of Health   Financial Resource Strain: Not on file  Food Insecurity: Not on file  Transportation Needs: Not on file  Physical Activity: Not on file  Stress: Not on file  Social Connections: Not on file  Intimate Partner Violence: Not on file    Outpatient Medications Prior to Visit  Medication Sig Dispense Refill   ALPRAZolam (XANAX) 0.25 MG tablet Take 1 tablet (0.25 mg total) by mouth 2 (two) times daily as needed for anxiety. 60 tablet 2   aspirin EC 81 MG tablet Take 81 mg by mouth daily.     B Complex Vitamins (B COMPLEX PO) Place 1 mL under the tongue daily.     Biotin 1000 MCG tablet Take 1,000 mcg by mouth  daily.     bisoprolol (ZEBETA) 10 MG tablet Take 1 tablet (10 mg total) by mouth at bedtime. 90 tablet 1   buPROPion (WELLBUTRIN XL) 300 MG 24 hr tablet Take 1 tablet (300 mg total) by mouth every morning. 90 tablet 1   Calcium Carb-Cholecalciferol 600-800 MG-UNIT TABS Take 1 tablet by mouth daily.     Cholecalciferol (VITAMIN D) 2000 units tablet Take 2,000 Units by mouth daily.     fexofenadine (ALLEGRA) 180 MG tablet Take 180 mg by mouth every morning. Allergy Relief     levothyroxine (SYNTHROID) 75 MCG tablet TAKE 1 TABLET BY MOUTH  DAILY 90 tablet 0   nitrofurantoin, macrocrystal-monohydrate, (MACROBID) 100 MG capsule Take 1 capsule po qd for recurrent urinary tract infection. 90 capsule 1   Omega-3 Fatty Acids (OMEGA-3 FISH OIL) 1200 MG CAPS Take 1-2 capsules by mouth See admin instructions. 1 capsule in the morning, and 2 capsules in the evening     Potassium 99 MG TABS Take 1 tablet by  mouth daily.     sertraline (ZOLOFT) 100 MG tablet Take 2 tablets (200 mg total) by mouth every morning. 180 tablet 1   traZODone (DESYREL) 100 MG tablet TAKE 3 TABLETS BY MOUTH AT  BEDTIME AS NEEDED (Patient taking differently: TAKE 3 TABLETS BY MOUTH AT  BEDTIME AS NEEDED) 270 tablet 3   vitamin B-12 (CYANOCOBALAMIN) 1000 MCG tablet Take 1,000 mcg by mouth daily.     No facility-administered medications prior to visit.    Allergies  Allergen Reactions   Other     Review of Systems  Constitutional:  Negative for activity change, appetite change, chills, fatigue and fever.  HENT:  Negative for congestion, postnasal drip, rhinorrhea, sinus pressure, sinus pain, sneezing and sore throat.   Eyes: Negative.   Respiratory:  Negative for cough, chest tightness, shortness of breath and wheezing.   Cardiovascular:  Negative for chest pain and palpitations.  Gastrointestinal:  Negative for abdominal pain, constipation, diarrhea, nausea and vomiting.  Endocrine: Negative for cold intolerance, heat intolerance, polydipsia and polyuria.  Genitourinary:  Negative for dyspareunia, dysuria, flank pain, frequency and urgency.  Musculoskeletal:  Negative for arthralgias, back pain and myalgias.  Skin:  Negative for rash.  Allergic/Immunologic: Negative for environmental allergies.  Neurological:  Negative for dizziness, weakness and headaches.  Hematological:  Negative for adenopathy. Bruises/bleeds easily.       The patient has noticed unusual bruising right breast right shoulder right upper arm.  She also has bruising on the left buttock, left upper leg, and left shin.  Psychiatric/Behavioral:  The patient is not nervous/anxious.       Objective:    Physical Exam Vitals and nursing note reviewed.  Constitutional:      Appearance: Normal appearance. She is well-developed.  HENT:     Head: Normocephalic and atraumatic.     Nose: Nose normal.  Eyes:     Extraocular Movements: Extraocular  movements intact.     Conjunctiva/sclera: Conjunctivae normal.     Pupils: Pupils are equal, round, and reactive to light.  Cardiovascular:     Rate and Rhythm: Normal rate and regular rhythm.     Pulses: Normal pulses.     Heart sounds: Normal heart sounds.  Pulmonary:     Effort: Pulmonary effort is normal.     Breath sounds: Normal breath sounds.  Abdominal:     Palpations: Abdomen is soft.  Musculoskeletal:        General: Normal range of  motion.     Cervical back: Normal range of motion and neck supple.  Lymphadenopathy:     Cervical: No cervical adenopathy.  Skin:    General: Skin is warm and dry.     Capillary Refill: Capillary refill takes less than 2 seconds.          Comments: Dark purple bruising present on above areas.  They are tender to palpate.  Patient denies known injury or trauma to affected areas.  Neurological:     General: No focal deficit present.     Mental Status: She is alert and oriented to person, place, and time.  Psychiatric:        Mood and Affect: Mood normal.        Behavior: Behavior normal.        Thought Content: Thought content normal.        Judgment: Judgment normal.    Today's Vitals   09/19/21 1106  BP: 104/67  Pulse: 68  Temp: 98 F (36.7 C)  SpO2: 96%  Weight: 182 lb (82.6 kg)  Height: '5\' 5"'  (1.651 m)   Body mass index is 30.29 kg/m.   Wt Readings from Last 3 Encounters:  09/19/21 182 lb (82.6 kg)  08/14/21 180 lb 14.4 oz (82.1 kg)  05/14/21 184 lb 4.8 oz (83.6 kg)    Health Maintenance Due  Topic Date Due   HIV Screening  Never done   Zoster Vaccines- Shingrix (1 of 2) Never done    There are no preventive care reminders to display for this patient.   Lab Results  Component Value Date   TSH 1.150 08/14/2021   Lab Results  Component Value Date   WBC 6.0 09/19/2021   HGB 14.9 09/19/2021   HCT 43.7 09/19/2021   MCV 90 09/19/2021   PLT 178 09/19/2021   Lab Results  Component Value Date   NA 139  08/14/2021   K 4.7 08/14/2021   CO2 29 08/14/2021   GLUCOSE 78 08/14/2021   BUN 8 08/14/2021   CREATININE 0.63 08/14/2021   BILITOT 0.4 01/03/2021   ALKPHOS 75 01/03/2021   AST 15 01/03/2021   ALT 13 01/03/2021   PROT 7.2 01/03/2021   ALBUMIN 4.4 01/03/2021   CALCIUM 10.2 08/14/2021   EGFR 101 08/14/2021   Lab Results  Component Value Date   CHOL 215 (H) 01/03/2021   Lab Results  Component Value Date   HDL 49 01/03/2021   Lab Results  Component Value Date   LDLCALC 140 (H) 01/03/2021   Lab Results  Component Value Date   TRIG 146 01/03/2021   No results found for: CHOLHDL No results found for: HGBA1C     Assessment & Plan:  1. Bruising Patient with recent bruising to multiple areas without known injury or trauma.  Check CBC and INR for further evaluation.  Will notify patient of results when they are available and plan for treatment. - CBC - Protime-INR   Problem List Items Addressed This Visit   None Visit Diagnoses     Bruising    -  Primary   Relevant Orders   CBC (Completed)   Protime-INR (Completed)       Ronnell Freshwater, NP  This note was dictated using Systems analyst. Rapid proofreading was performed to expedite the delivery of the information. Despite proofreading, phonetic errors will occur which are common with this voice recognition software. Please take this into consideration. If there are any concerns, please  contact our office.

## 2021-09-20 LAB — CBC
Hematocrit: 43.7 % (ref 34.0–46.6)
Hemoglobin: 14.9 g/dL (ref 11.1–15.9)
MCH: 30.6 pg (ref 26.6–33.0)
MCHC: 34.1 g/dL (ref 31.5–35.7)
MCV: 90 fL (ref 79–97)
Platelets: 178 10*3/uL (ref 150–450)
RBC: 4.87 x10E6/uL (ref 3.77–5.28)
RDW: 11.8 % (ref 11.7–15.4)
WBC: 6 10*3/uL (ref 3.4–10.8)

## 2021-09-20 LAB — PROTIME-INR
INR: 1 (ref 0.9–1.2)
Prothrombin Time: 10.2 s (ref 9.1–12.0)

## 2021-09-23 NOTE — Progress Notes (Signed)
Please let the patient know that her blood count and INR were both within normal limits. Can you find out how her bruising is doing? Thanks so much.   -HB

## 2021-09-26 ENCOUNTER — Other Ambulatory Visit: Payer: Self-pay | Admitting: Internal Medicine

## 2021-09-26 DIAGNOSIS — F5101 Primary insomnia: Secondary | ICD-10-CM

## 2021-11-14 ENCOUNTER — Encounter: Payer: Self-pay | Admitting: Nurse Practitioner

## 2021-11-14 ENCOUNTER — Other Ambulatory Visit: Payer: Self-pay

## 2021-11-14 ENCOUNTER — Ambulatory Visit (INDEPENDENT_AMBULATORY_CARE_PROVIDER_SITE_OTHER): Payer: 59 | Admitting: Nurse Practitioner

## 2021-11-14 VITALS — BP 116/69 | HR 64 | Temp 98.0°F | Ht 65.0 in | Wt 185.5 lb

## 2021-11-14 DIAGNOSIS — R319 Hematuria, unspecified: Secondary | ICD-10-CM | POA: Diagnosis not present

## 2021-11-14 DIAGNOSIS — F411 Generalized anxiety disorder: Secondary | ICD-10-CM

## 2021-11-14 DIAGNOSIS — E039 Hypothyroidism, unspecified: Secondary | ICD-10-CM

## 2021-11-14 DIAGNOSIS — Z0001 Encounter for general adult medical examination with abnormal findings: Secondary | ICD-10-CM

## 2021-11-14 DIAGNOSIS — I10 Essential (primary) hypertension: Secondary | ICD-10-CM | POA: Diagnosis not present

## 2021-11-14 DIAGNOSIS — Z6830 Body mass index (BMI) 30.0-30.9, adult: Secondary | ICD-10-CM

## 2021-11-14 LAB — POCT URINALYSIS DIP (CLINITEK)
Bilirubin, UA: NEGATIVE
Glucose, UA: NEGATIVE mg/dL
Ketones, POC UA: NEGATIVE mg/dL
Leukocytes, UA: NEGATIVE
Nitrite, UA: NEGATIVE
POC PROTEIN,UA: NEGATIVE
Spec Grav, UA: 1.01 (ref 1.010–1.025)
Urobilinogen, UA: 0.2 E.U./dL
pH, UA: 7 (ref 5.0–8.0)

## 2021-11-14 NOTE — Progress Notes (Signed)
Established Patient Office Visit  Subjective:  Patient ID: Grace Christensen, female    DOB: 05-11-1961  Age: 60 y.o. MRN: [id number]  CC:  Chief Complaint  Patient presents with   Annual Exam    HPI Grace Christensen presents for annual wellness visit. She states that she had stopped going to weight loss provider after she fell and fractured her collar bone. She states that she plans on starting back to that provider soon.  He is concerned about 3 pound weight gain, but again, plans to see weight loss proivder again soon.  Blood pressure well controlled.  Last pap was done 2021 and was negative with negative HPV Mammogram done 10/2021 and was negative.   Past Medical History:  Diagnosis Date   Anemia    H/O YEARS AGO   Cancer ([place])    SKIN CANCER ON FACE   GERD (gastroesophageal reflux disease)    Hypertension    Hypothyroidism    Thyroid disease    Phreesia 01/21/2021    Past Surgical History:  Procedure Laterality Date   COLONOSCOPY     FUNCTIONAL ENDOSCOPIC SINUS SURGERY     KNEE ARTHROSCOPY Right 03/04/2017   Procedure: ARTHROSCOPY KNEE, REMOVAL OF LOOSE BODY. CHONDROPLASTY MEDIAL COMPARTMENT;  Surgeon: Dereck Leep, MD;  Location: [facility] ORS;  Service: Orthopedics;  Laterality: Right;   NOVASURE ABLATION     TUBAL LIGATION      Family History  Problem Relation Age of Onset   Cancer Mother    Hypertension Mother    Prostate cancer Father     Social History   Socioeconomic History   Marital status: Married    Spouse name: Not on file   Number of children: Not on file   Years of education: Not on file   Highest education level: Not on file  Occupational History   Occupation: Arboriculturist    Employer: [employer]  Tobacco Use   Smoking status: Former    Packs/day: 1.00    Years: 15.00    Pack years: 15.00    Types: Cigarettes    Quit date: 07/27/2009    Years since quitting: 12.3   Smokeless tobacco: Never  Substance and Sexual Activity   Alcohol use: Yes     Comment: OCC WEEKEND    Drug use: No   Sexual activity: Yes  Other Topics Concern   Not on file  Social History Narrative   Not on file   Social Determinants of Health   Financial Resource Strain: Not on file  Food Insecurity: Not on file  Transportation Needs: Not on file  Physical Activity: Not on file  Stress: Not on file  Social Connections: Not on file  Intimate Partner Violence: Not on file    Outpatient Medications Prior to Visit  Medication Sig Dispense Refill   ALPRAZolam (XANAX) 0.25 MG tablet Take 1 tablet (0.25 mg total) by mouth 2 (two) times daily as needed for anxiety. 60 tablet 2   B Complex Vitamins (B COMPLEX PO) Place 1 mL under the tongue daily.     Biotin 1000 MCG tablet Take 1,000 mcg by mouth daily.     bisoprolol (ZEBETA) 10 MG tablet Take 1 tablet (10 mg total) by mouth at bedtime. 90 tablet 1   buPROPion (WELLBUTRIN XL) 300 MG 24 hr tablet Take 1 tablet (300 mg total) by mouth every morning. 90 tablet 1   Calcium Carb-Cholecalciferol 600-800 MG-UNIT TABS Take 1 tablet by mouth daily.  Cholecalciferol (VITAMIN D) 2000 units tablet Take 2,000 Units by mouth daily.     fexofenadine (ALLEGRA) 180 MG tablet Take 180 mg by mouth every morning. Allergy Relief     levothyroxine (SYNTHROID) 75 MCG tablet TAKE 1 TABLET BY MOUTH  DAILY 90 tablet 0   nitrofurantoin, macrocrystal-monohydrate, (MACROBID) 100 MG capsule Take 1 capsule po qd for recurrent urinary tract infection. 90 capsule 1   Omega-3 Fatty Acids (OMEGA-3 FISH OIL) 1200 MG CAPS Take 1-2 capsules by mouth See admin instructions. 1 capsule in the morning, and 2 capsules in the evening     Potassium 99 MG TABS Take 1 tablet by mouth daily.     sertraline (ZOLOFT) 100 MG tablet Take 2 tablets (200 mg total) by mouth every morning. 180 tablet 1   traZODone (DESYREL) 100 MG tablet TAKE 3 TABLETS BY MOUTH AT  BEDTIME AS NEEDED (Patient taking differently: TAKE 3 TABLETS BY MOUTH AT  BEDTIME AS NEEDED) 270  tablet 3   vitamin B-12 (CYANOCOBALAMIN) 1000 MCG tablet Take 1,000 mcg by mouth daily.     aspirin EC 81 MG tablet Take 81 mg by mouth daily. (Patient not taking: Reported on 11/14/2021)     No facility-administered medications prior to visit.    Allergies  Allergen Reactions   Other     ROS Review of Systems  Constitutional:  Negative for activity change, appetite change, chills, fatigue and fever.       Three pound weight gain since her most recent visit.   HENT:  Negative for congestion, postnasal drip, rhinorrhea, sinus pressure, sinus pain, sneezing and sore throat.   Eyes: Negative.   Respiratory:  Negative for cough, chest tightness, shortness of breath and wheezing.   Cardiovascular:  Negative for chest pain and palpitations.  Gastrointestinal:  Negative for abdominal pain, constipation, diarrhea, nausea and vomiting.  Endocrine: Negative for cold intolerance, heat intolerance, polydipsia and polyuria.  Genitourinary:  Positive for hematuria. Negative for dyspareunia, dysuria, flank pain, frequency and urgency.  Musculoskeletal:  Negative for arthralgias, back pain and myalgias.  Skin:  Negative for rash.  Allergic/Immunologic: Negative for environmental allergies.  Neurological:  Negative for dizziness, weakness and headaches.  Hematological:  Negative for adenopathy.  Psychiatric/Behavioral:  The patient is not nervous/anxious.      Objective:    Physical Exam Vitals and nursing note reviewed.  Constitutional:      Appearance: Normal appearance. She is well-developed.  HENT:     Head: Normocephalic and atraumatic.     Right Ear: Tympanic membrane, ear canal and external ear normal.     Left Ear: Tympanic membrane, ear canal and external ear normal.     Nose: Nose normal.     Mouth/Throat:     Mouth: Mucous membranes are moist.     Pharynx: Oropharynx is clear.  Eyes:     Extraocular Movements: Extraocular movements intact.     Conjunctiva/sclera: Conjunctivae  normal.     Pupils: Pupils are equal, round, and reactive to light.  Neck:     Vascular: No carotid bruit.  Cardiovascular:     Rate and Rhythm: Normal rate and regular rhythm.     Pulses: Normal pulses.     Heart sounds: Normal heart sounds.  Pulmonary:     Effort: Pulmonary effort is normal.     Breath sounds: Normal breath sounds.  Chest:  Breasts:    Right: Normal. No swelling, bleeding, inverted nipple, mass, nipple discharge, skin change or tenderness.  Left: Normal. No swelling, bleeding, inverted nipple, mass, nipple discharge, skin change or tenderness.  Abdominal:     General: Bowel sounds are normal. There is no distension.     Palpations: Abdomen is soft. There is no mass.     Tenderness: There is no abdominal tenderness. There is no guarding or rebound.     Hernia: No hernia is present.  Musculoskeletal:        General: Normal range of motion.     Cervical back: Normal range of motion and neck supple.  Lymphadenopathy:     Cervical: No cervical adenopathy.     Upper Body:     Right upper body: No axillary adenopathy.     Left upper body: No axillary adenopathy.  Skin:    General: Skin is warm and dry.     Capillary Refill: Capillary refill takes less than 2 seconds.  Neurological:     General: No focal deficit present.     Mental Status: She is alert and oriented to person, place, and time.  Psychiatric:        Mood and Affect: Mood normal.        Behavior: Behavior normal.        Thought Content: Thought content normal.        Judgment: Judgment normal.    Today's Vitals   11/14/21 1050  BP: 116/69  Pulse: 64  Temp: 98 F (36.7 C)  SpO2: 94%  Weight: 185 lb 8 oz (84.1 kg)  Height: _0  (1.651 m)   Body mass index is 30.87 kg/m.   Wt Readings from Last 3 Encounters:  11/14/21 185 lb 8 oz (84.1 kg)  09/19/21 182 lb (82.6 kg)  08/14/21 180 lb 14.4 oz (82.1 kg)     Health Maintenance Due  Topic Date Due   HIV Screening  Never done    TETANUS/TDAP  Never done   Zoster Vaccines- Shingrix (1 of 2) Never done   COVID-19 Vaccine (4 - Booster for Pfizer series) 10/06/2021    There are no preventive care reminders to display for this patient.  Lab Results  Component Value Date   TSH 1.150 08/14/2021   Lab Results  Component Value Date   WBC 6.0 09/19/2021   HGB 14.9 09/19/2021   HCT 43.7 09/19/2021   MCV 90 09/19/2021   PLT 178 09/19/2021   Lab Results  Component Value Date   NA 139 08/14/2021   K 4.7 08/14/2021   CO2 29 08/14/2021   GLUCOSE 78 08/14/2021   BUN 8 08/14/2021   CREATININE 0.63 08/14/2021   BILITOT 0.4 01/03/2021   ALKPHOS 75 01/03/2021   AST 15 01/03/2021   ALT 13 01/03/2021   PROT 7.2 01/03/2021   ALBUMIN 4.4 01/03/2021   CALCIUM 10.2 08/14/2021   EGFR 101 08/14/2021   Lab Results  Component Value Date   CHOL 215 (H) 01/03/2021   Lab Results  Component Value Date   HDL 49 01/03/2021   Lab Results  Component Value Date   LDLCALC 140 (H) 01/03/2021   Lab Results  Component Value Date   TRIG 146 01/03/2021      Assessment & Plan:  1. Encounter for general adult medical examination with abnormal findings Annual wellness visit today.  2. Hematuria, unspecified type Urine sample today positive for trace blood only.  This is marked improvement.  We will have her continue Macrobid daily to prevent acute urinary tract infection.  We will recheck urine sample  at next visit. - POCT URINALYSIS DIP (CLINITEK)  3. Acquired hypothyroidism Thyroid panel chronic stable.  Continue thyroxine as prescribed.  4. Hypertension, unspecified type Stable.  Continue blood pressure medication as prescribed.  5. Body mass index (BMI) of 30.0-30.9 in adult Discussed lowering calorie intake to 1500 calories per day and incorporating exercise into daily routine to help lose weight.  Patient planning to go back to weight loss clinic for further treatment.  6. Generalized anxiety disorder Stable.   Continue medications as prescribed.  We will refill as indicated.  Problem List Items Addressed This Visit       Cardiovascular and Mediastinum   Hypertension     Endocrine   Acquired hypothyroidism     Other   Generalized anxiety disorder   Body mass index (BMI) of 30.0-30.9 in adult   Hematuria   Relevant Orders   POCT URINALYSIS DIP (CLINITEK) (Completed)   Other Visit Diagnoses     Encounter for general adult medical examination with abnormal findings    -  Primary       Follow-up: Return in about 3 months (around 02/12/2022) for blood pressure, mood.    Ronnell Freshwater, NP  This note was dictated using Systems analyst. Rapid proofreading was performed to expedite the delivery of the information. Despite proofreading, phonetic errors will occur which are common with this voice recognition software. Please take this into consideration. If there are any concerns, please contact our office.

## 2021-11-14 NOTE — Progress Notes (Signed)
Please let the patient know that her urine sample is showing only a trace of blood now. This is improved from last few times we checked. I would like her to conitnue taking the macrobid daily. We can recheck the urine again at her next follow up visit.  Thanks so much.   -HB

## 2021-11-25 DIAGNOSIS — R319 Hematuria, unspecified: Secondary | ICD-10-CM | POA: Insufficient documentation

## 2021-11-25 NOTE — Patient Instructions (Signed)
Fat and Cholesterol Restricted Eating Plan Getting too much fat and cholesterol in your diet may cause health problems. Choosing the right foods helps keep your fat and cholesterol at normal levels. This can keep you from getting certain diseases. Your doctor may recommend an eating plan that includes: Total fat: ______% or less of total calories a day. This is ______g of fat a day. Saturated fat: ______% or less of total calories a day. This is ______g of saturated fat a day. Cholesterol: less than _________mg a day. Fiber: ______g a day. What are tips for following this plan? General tips Work with your doctor to lose weight if you need to. Avoid: Foods with added sugar. Fried foods. Foods with trans fat or partially hydrogenated oils. This includes some margarines and baked goods. If you drink alcohol: Limit how much you have to: 0-1 drink a day for women who are not pregnant. 0-2 drinks a day for men. Know how much alcohol is in a drink. In the [place] one drink equals one 12 oz bottle of beer (355 mL), one 5 oz glass of wine (148 mL), or one 1 oz glass of hard liquor (44 mL). Reading food labels Check food labels for: Trans fats. Partially hydrogenated oils. Saturated fat (g) in each serving. Cholesterol (mg) in each serving. Fiber (g) in each serving. Choose foods with healthy fats, such as: Monounsaturated fats and polyunsaturated fats. These include olive and canola oil, flaxseeds, walnuts, almonds, and seeds. Omega-3 fats. These are found in certain fish, flaxseed oil, and ground flaxseeds. Choose grain products that have whole grains. Look for the word "whole" as the first word in the ingredient list. Cooking Cook foods using low-fat methods. These include baking, boiling, grilling, and broiling. Eat more home-cooked foods. Eat at restaurants and buffets less often. Eat less fast food. Avoid cooking using saturated fats, such as butter, cream, palm oil, palm kernel oil, and  coconut oil. Meal planning  At meals, divide your plate into four equal parts: Fill one-half of your plate with vegetables, green salads, and fruit. Fill one-fourth of your plate with whole grains. Fill one-fourth of your plate with low-fat (lean) protein foods. Eat fish that is high in omega-3 fats at least two times a week. This includes mackerel, tuna, sardines, and salmon. Eat foods that are high in fiber, such as whole grains, beans, apples, pears, berries, broccoli, carrots, peas, and barley. What foods should I eat? Fruits All fresh, canned (in natural juice), or frozen fruits. Vegetables Fresh or frozen vegetables (raw, steamed, roasted, or grilled). Green salads. Grains Whole grains, such as whole wheat or whole grain breads, crackers, cereals, and pasta. Unsweetened oatmeal, bulgur, barley, quinoa, or brown rice. Corn or whole wheat flour tortillas. Meats and other protein foods Ground beef (85% or leaner), grass-fed beef, or beef trimmed of fat. Skinless chicken or turkey. Ground chicken or turkey. Pork trimmed of fat. All fish and seafood. Egg whites. Dried beans, peas, or lentils. Unsalted nuts or seeds. Unsalted canned beans. Nut butters without added sugar or oil. Dairy Low-fat or nonfat dairy products, such as skim or 1% milk, 2% or reduced-fat cheeses, low-fat and fat-free ricotta or cottage cheese, or plain low-fat and nonfat yogurt. Fats and oils Tub margarine without trans fats. Light or reduced-fat mayonnaise and salad dressings. Avocado. Olive, canola, sesame, or safflower oils. The items listed above may not be a complete list of foods and beverages you can eat. Contact a dietitian for more information. What foods   should I avoid? Fruits Canned fruit in heavy syrup. Fruit in cream or butter sauce. Fried fruit. Vegetables Vegetables cooked in cheese, cream, or butter sauce. Fried vegetables. Grains White bread. White pasta. White rice. Cornbread. Bagels, pastries,  and croissants. Crackers and snack foods that contain trans fat and hydrogenated oils. Meats and other protein foods Fatty cuts of meat. Ribs, chicken wings, bacon, sausage, bologna, salami, chitterlings, fatback, hot dogs, bratwurst, and packaged lunch meats. Liver and organ meats. Whole eggs and egg yolks. Chicken and turkey with skin. Fried meat. Dairy Whole or 2% milk, cream, half-and-half, and cream cheese. Whole milk cheeses. Whole-fat or sweetened yogurt. Full-fat cheeses. Nondairy creamers and whipped toppings. Processed cheese, cheese spreads, and cheese curds. Fats and oils Butter, stick margarine, lard, shortening, ghee, or bacon fat. Coconut, palm kernel, and palm oils. Beverages Alcohol. Sugar-sweetened drinks such as sodas, lemonade, and fruit drinks. Sweets and desserts Corn syrup, sugars, honey, and molasses. Candy. Jam and jelly. Syrup. Sweetened cereals. Cookies, pies, cakes, donuts, muffins, and ice cream. The items listed above may not be a complete list of foods and beverages you should avoid. Contact a dietitian for more information. Summary Choosing the right foods helps keep your fat and cholesterol at normal levels. This can keep you from getting certain diseases. At meals, fill one-half of your plate with vegetables, green salads, and fruits. Eat high fiber foods, like whole grains, beans, apples, pears, berries, carrots, peas, and barley. Limit added sugar, saturated fats, alcohol, and fried foods. This information is not intended to replace advice given to you by your health care provider. Make sure you discuss any questions you have with your health care provider. Document Revised: 03/29/2021 Document Reviewed: 03/29/2021 Elsevier Patient Education  2022 [employer]  

## 2021-11-26 ENCOUNTER — Telehealth: Payer: Self-pay | Admitting: Nurse Practitioner

## 2021-11-26 ENCOUNTER — Other Ambulatory Visit: Payer: Self-pay | Admitting: Nurse Practitioner

## 2021-11-26 ENCOUNTER — Other Ambulatory Visit: Payer: Self-pay

## 2021-11-26 DIAGNOSIS — N390 Urinary tract infection, site not specified: Secondary | ICD-10-CM

## 2021-11-26 MED ORDER — NITROFURANTOIN MONOHYD MACRO 100 MG PO CAPS: ORAL_CAPSULE | ORAL | 1 refills | Status: DC

## 2021-11-26 NOTE — Telephone Encounter (Signed)
Rx was sent to pharmacy. 

## 2021-11-26 NOTE — Telephone Encounter (Signed)
Patient called requesting refill of the Nitrofuratoin to be sent to [facility]. [phone]

## 2021-12-02 ENCOUNTER — Other Ambulatory Visit: Payer: Self-pay | Admitting: Nurse Practitioner

## 2021-12-02 DIAGNOSIS — E039 Hypothyroidism, unspecified: Secondary | ICD-10-CM

## 2021-12-05 ENCOUNTER — Other Ambulatory Visit: Payer: Self-pay | Admitting: Nurse Practitioner

## 2021-12-05 DIAGNOSIS — F411 Generalized anxiety disorder: Secondary | ICD-10-CM

## 2021-12-26 ENCOUNTER — Encounter: Payer: 59 | Admitting: Physician Assistant

## 2022-01-24 ENCOUNTER — Other Ambulatory Visit: Payer: Self-pay | Admitting: Nurse Practitioner

## 2022-01-24 DIAGNOSIS — F411 Generalized anxiety disorder: Secondary | ICD-10-CM

## 2022-02-13 ENCOUNTER — Other Ambulatory Visit: Payer: Self-pay | Admitting: Nurse Practitioner

## 2022-03-29 ENCOUNTER — Other Ambulatory Visit: Payer: Self-pay | Admitting: Nurse Practitioner

## 2022-03-29 DIAGNOSIS — I10 Essential (primary) hypertension: Secondary | ICD-10-CM

## 2022-03-31 ENCOUNTER — Telehealth: Payer: Self-pay | Admitting: Nurse Practitioner

## 2022-03-31 DIAGNOSIS — F5101 Primary insomnia: Secondary | ICD-10-CM

## 2022-03-31 MED ORDER — TRAZODONE HCL 100 MG PO TABS: ORAL_TABLET | ORAL | 0 refills | Status: DC

## 2022-03-31 NOTE — Telephone Encounter (Signed)
Patient last seen in December and advised to follow up in 3 months (March). Patient has been non-compliant with follow up and will require an appointment before further Xanax will be sent to pharmacy. This is per our office protocol.  ? ?I will send in a 15 day supply of Trazodone but none of the Xanax.  ? ?AS, CMA ?

## 2022-03-31 NOTE — Telephone Encounter (Signed)
Patient requesting refill of Trazodone and Xanax. Please advise. [phone] ?

## 2022-04-03 ENCOUNTER — Ambulatory Visit: Payer: 59 | Admitting: Nurse Practitioner

## 2022-04-07 ENCOUNTER — Encounter: Payer: Self-pay | Admitting: Nurse Practitioner

## 2022-04-07 ENCOUNTER — Ambulatory Visit (INDEPENDENT_AMBULATORY_CARE_PROVIDER_SITE_OTHER): Payer: 59 | Admitting: Nurse Practitioner

## 2022-04-07 VITALS — BP 120/76 | HR 66 | Temp 98.0°F | Ht 64.96 in | Wt 187.8 lb

## 2022-04-07 DIAGNOSIS — Z23 Encounter for immunization: Secondary | ICD-10-CM | POA: Diagnosis not present

## 2022-04-07 DIAGNOSIS — F5101 Primary insomnia: Secondary | ICD-10-CM

## 2022-04-07 DIAGNOSIS — F411 Generalized anxiety disorder: Secondary | ICD-10-CM | POA: Diagnosis not present

## 2022-04-07 DIAGNOSIS — N390 Urinary tract infection, site not specified: Secondary | ICD-10-CM

## 2022-04-07 DIAGNOSIS — I10 Essential (primary) hypertension: Secondary | ICD-10-CM | POA: Diagnosis not present

## 2022-04-07 DIAGNOSIS — E039 Hypothyroidism, unspecified: Secondary | ICD-10-CM

## 2022-04-07 DIAGNOSIS — F324 Major depressive disorder, single episode, in partial remission: Secondary | ICD-10-CM

## 2022-04-07 DIAGNOSIS — Z6831 Body mass index (BMI) 31.0-31.9, adult: Secondary | ICD-10-CM

## 2022-04-07 MED ORDER — NITROFURANTOIN MONOHYD MACRO 100 MG PO CAPS: ORAL_CAPSULE | ORAL | 1 refills | Status: DC

## 2022-04-07 MED ORDER — LEVOTHYROXINE SODIUM 75 MCG PO TABS: 75.0000 ug | ORAL_TABLET | Freq: Every day | ORAL | 1 refills | Status: DC

## 2022-04-07 MED ORDER — ALPRAZOLAM 0.25 MG PO TABS: 0.2500 mg | ORAL_TABLET | Freq: Two times a day (BID) | ORAL | 3 refills | Status: DC | PRN

## 2022-04-07 MED ORDER — TRAZODONE HCL 100 MG PO TABS: ORAL_TABLET | ORAL | 3 refills | Status: DC

## 2022-04-07 MED ORDER — BISOPROLOL FUMARATE 10 MG PO TABS: 10.0000 mg | ORAL_TABLET | Freq: Every day | ORAL | 1 refills | Status: DC

## 2022-04-07 NOTE — Addendum Note (Signed)
Addended by: Vivia Birmingham on: 04/07/2022 02:14 PM ? ? Modules accepted: Orders ? ?

## 2022-04-07 NOTE — Progress Notes (Signed)
Established patient visit ? ? ?Patient: Grace Christensen   DOB: May 05, 1961   61 y.o. Female  MRN: [id number] ?Visit Date: 04/07/2022 ? ? ?Chief Complaint  ?Patient presents with  ? Medication Refill  ? ?Subjective  ?  ?Medication Refill ?Pertinent negatives include no abdominal pain, arthralgias, chest pain, chills, congestion, coughing, fatigue, fever, headaches, myalgias, nausea, rash, sore throat, vomiting or weakness.   ?The patient is here for follow up for anxiety/depression.  ?-currently taking sertraline '200mg'$  daily, bupropion XL '300mg'$  daily, and alprazolam 0.'25mg'$  twice daily if needed. She does take trazodone '100mg'$  at bedtime as needed for insomnia. States that symptoms are currently well managed. Needs refills of medication.  ?-blood pressure well managed. ?-taking macrobid '100mg'$  daily to prevent chronic UTI. States that she has no symptoms.  ?-thyroid has been well managed.  ?-has had no further instances of bruising. Did stop taking aspirin '81mg'$ .  ?denies chest pain, chest pressure, or shortness of breath. She denies headaches or visual disturbances. She denies abdominal pain, nausea, vomiting, or changes in bowel or bladder habits.   ? ?PDMP profile reviewed today. Her overdose risk score is 240. Her fill history of alprazolam is appropriate.  ? ? ?Medications: ?Outpatient Medications Prior to Visit  ?Medication Sig  ? B Complex Vitamins (B COMPLEX PO) Place 1 mL under the tongue daily.  ? Biotin 1000 MCG tablet Take 1,000 mcg by mouth daily.  ? buPROPion (WELLBUTRIN XL) 300 MG 24 hr tablet TAKE 1 TABLET BY MOUTH IN  THE MORNING  ? Calcium Carb-Cholecalciferol 600-800 MG-UNIT TABS Take 1 tablet by mouth daily.  ? Cholecalciferol (VITAMIN D) 2000 units tablet Take 2,000 Units by mouth daily.  ? fexofenadine (ALLEGRA) 180 MG tablet Take 180 mg by mouth every morning. Allergy Relief  ? Omega-3 Fatty Acids (OMEGA-3 FISH OIL) 1200 MG CAPS Take 1-2 capsules by mouth See admin instructions. 1 capsule in the  morning, and 2 capsules in the evening  ? Potassium 99 MG TABS Take 1 tablet by mouth daily.  ? sertraline (ZOLOFT) 100 MG tablet TAKE 2 TABLETS BY MOUTH  DAILY  ? vitamin B-12 (CYANOCOBALAMIN) 1000 MCG tablet Take 1,000 mcg by mouth daily.  ? [DISCONTINUED] ALPRAZolam (XANAX) 0.25 MG tablet Take 1 tablet (0.25 mg total) by mouth 2 (two) times daily as needed for anxiety.  ? [DISCONTINUED] bisoprolol (ZEBETA) 10 MG tablet Take 1 tablet (10 mg total) by mouth at bedtime. **PLEASE CONTACT OUR OFFICE TO SCHEDULE A FOLLOW UP FOR FUTURE MED REFILLS**  ? [DISCONTINUED] levothyroxine (SYNTHROID) 75 MCG tablet TAKE 1 TABLET BY MOUTH  DAILY  ? [DISCONTINUED] nitrofurantoin, macrocrystal-monohydrate, (MACROBID) 100 MG capsule Take 1 capsule po qd for recurrent urinary tract infection.  ? [DISCONTINUED] traZODone (DESYREL) 100 MG tablet TAKE 3 TABLETS BY MOUTH AT  BEDTIME AS NEEDED  ? [DISCONTINUED] aspirin EC 81 MG tablet Take 81 mg by mouth daily. (Patient not taking: Reported on 11/14/2021)  ? ?No facility-administered medications prior to visit.  ? ? ?Review of Systems  ?Constitutional:  Negative for activity change, appetite change, chills, fatigue and fever.  ?HENT:  Negative for congestion, postnasal drip, rhinorrhea, sinus pressure, sinus pain, sneezing and sore throat.   ?Eyes: Negative.   ?Respiratory:  Negative for cough, chest tightness, shortness of breath and wheezing.   ?Cardiovascular:  Negative for chest pain and palpitations.  ?Gastrointestinal:  Negative for abdominal pain, constipation, diarrhea, nausea and vomiting.  ?Endocrine: Negative for cold intolerance, heat intolerance, polydipsia and polyuria.  ?  Well controlled hypothyroid  ?Genitourinary:  Negative for dyspareunia, dysuria, flank pain, frequency and urgency.  ?     Chronic uti. Doing well on daily macrobid  ?Musculoskeletal:  Negative for arthralgias, back pain and myalgias.  ?Skin:  Negative for rash.  ?Allergic/Immunologic: Positive for  environmental allergies.  ?Neurological:  Negative for dizziness, weakness and headaches.  ?Hematological:  Negative for adenopathy.  ?Psychiatric/Behavioral:  Positive for dysphoric mood and sleep disturbance. The patient is nervous/anxious.   ?     Doing well with current doses medications   ? ? ? ? Objective  ?  ? ?Today's Vitals  ? 04/07/22 1313  ?BP: 120/76  ?Pulse: 66  ?Temp: 98 ?F (36.7 ?C)  ?SpO2: 96%  ?Weight: 187 lb 12.8 oz (85.2 kg)  ?Height: 5' 4.96" (1.65 m)  ? ?Body mass index is 31.29 kg/m?.  ? ?BP Readings from Last 3 Encounters:  ?04/07/22 120/76  ?11/14/21 116/69  ?09/19/21 104/67  ?  ?Wt Readings from Last 3 Encounters:  ?04/07/22 187 lb 12.8 oz (85.2 kg)  ?11/14/21 185 lb 8 oz (84.1 kg)  ?09/19/21 182 lb (82.6 kg)  ?  ?Physical Exam ?Vitals and nursing note reviewed.  ?Constitutional:   ?   Appearance: Normal appearance. She is well-developed.  ?HENT:  ?   Head: Normocephalic and atraumatic.  ?   Nose: Nose normal.  ?   Mouth/Throat:  ?   Mouth: Mucous membranes are moist.  ?   Pharynx: Oropharynx is clear.  ?Eyes:  ?   Extraocular Movements: Extraocular movements intact.  ?   Conjunctiva/sclera: Conjunctivae normal.  ?   Pupils: Pupils are equal, round, and reactive to light.  ?Cardiovascular:  ?   Rate and Rhythm: Normal rate and regular rhythm.  ?   Pulses: Normal pulses.  ?   Heart sounds: Normal heart sounds.  ?Pulmonary:  ?   Effort: Pulmonary effort is normal.  ?   Breath sounds: Normal breath sounds.  ?Abdominal:  ?   Palpations: Abdomen is soft.  ?Musculoskeletal:     ?   General: Normal range of motion.  ?   Cervical back: Normal range of motion and neck supple.  ?Lymphadenopathy:  ?   Cervical: No cervical adenopathy.  ?Skin: ?   General: Skin is warm and dry.  ?   Capillary Refill: Capillary refill takes less than 2 seconds.  ?Neurological:  ?   General: No focal deficit present.  ?   Mental Status: She is alert and oriented to person, place, and time.  ?Psychiatric:     ?   Mood and  Affect: Mood normal.     ?   Behavior: Behavior normal.     ?   Thought Content: Thought content normal.     ?   Judgment: Judgment normal.  ?  ? Assessment & Plan  ?  ?1. Hypertension, unspecified type ?Stable. Continue current bp medications as prescribed. Refills provided.  ?- bisoprolol (ZEBETA) 10 MG tablet; Take 1 tablet (10 mg total) by mouth at bedtime. **PLEASE CONTACT OUR OFFICE TO SCHEDULE A FOLLOW UP FOR FUTURE MED REFILLS**  Dispense: 90 tablet; Refill: 1 ? ?2. Acquired hypothyroidism ?Continue levothyroxine 4mg daily. Recheck thyroid panel at next visit and adjust levothyroxine dosing as indicated  ?- levothyroxine (SYNTHROID) 75 MCG tablet; Take 1 tablet (75 mcg total) by mouth daily.  Dispense: 90 tablet; Refill: 1 ? ?3. Generalized anxiety disorder ?Continue sertraline and bupropion daily. May take alprazolam 0.'25mg'$  twice daily  as needed. New prescription sent to her pharmacy today.  ?- ALPRAZolam (XANAX) 0.25 MG tablet; Take 1 tablet (0.25 mg total) by mouth 2 (two) times daily as needed for anxiety.  Dispense: 60 tablet; Refill: 3 ? ?4. Major depressive disorder in partial remission, unspecified whether recurrent ([place]) ?Continue sertraline and bupropion daily.  ? ?5. Primary insomnia ?May take trazodone up to 3 tablets at bedtime as needed for acute insomnia. Refills provided today  ?- traZODone (DESYREL) 100 MG tablet; TAKE 3 TABLETS BY MOUTH AT  BEDTIME AS NEEDED  Dispense: 135 tablet; Refill: 3 ? ?6. Recurrent urinary tract infection ?Continue macrobid '100mg'$  daily. Recheck urine dip at next visit and adjust dosing as indicated.  ?- nitrofurantoin, macrocrystal-monohydrate, (MACROBID) 100 MG capsule; Take 1 capsule po qd for recurrent urinary tract infection.  Dispense: 90 capsule; Refill: 1 ? ?7. Body mass index (BMI) of 31.0-31.9 in adult ?Discussed lowering calorie intake to 1500 calories per day and incorporating exercise into daily routine to help lose weight.  ? ?Problem List Items  Addressed This Visit   ? ?  ? Cardiovascular and Mediastinum  ? Hypertension - Primary  ? Relevant Medications  ? bisoprolol (ZEBETA) 10 MG tablet  ?  ? Endocrine  ? Acquired hypothyroidism  ? Relevant Medications

## 2022-05-01 ENCOUNTER — Other Ambulatory Visit: Payer: Self-pay | Admitting: Physician Assistant

## 2022-05-01 ENCOUNTER — Other Ambulatory Visit: Payer: Self-pay | Admitting: Nurse Practitioner

## 2022-05-01 DIAGNOSIS — I10 Essential (primary) hypertension: Secondary | ICD-10-CM

## 2022-05-01 DIAGNOSIS — N390 Urinary tract infection, site not specified: Secondary | ICD-10-CM

## 2022-05-01 DIAGNOSIS — F5101 Primary insomnia: Secondary | ICD-10-CM

## 2022-05-01 DIAGNOSIS — E039 Hypothyroidism, unspecified: Secondary | ICD-10-CM

## 2022-05-03 ENCOUNTER — Other Ambulatory Visit: Payer: Self-pay | Admitting: Physician Assistant

## 2022-05-03 ENCOUNTER — Other Ambulatory Visit: Payer: Self-pay | Admitting: Nurse Practitioner

## 2022-05-03 DIAGNOSIS — I10 Essential (primary) hypertension: Secondary | ICD-10-CM

## 2022-05-03 DIAGNOSIS — N390 Urinary tract infection, site not specified: Secondary | ICD-10-CM

## 2022-05-03 DIAGNOSIS — E039 Hypothyroidism, unspecified: Secondary | ICD-10-CM

## 2022-05-03 DIAGNOSIS — F5101 Primary insomnia: Secondary | ICD-10-CM

## 2022-05-23 ENCOUNTER — Other Ambulatory Visit: Payer: Self-pay | Admitting: Nurse Practitioner

## 2022-05-23 DIAGNOSIS — F5101 Primary insomnia: Secondary | ICD-10-CM

## 2022-06-06 ENCOUNTER — Other Ambulatory Visit: Payer: Self-pay | Admitting: Physician Assistant

## 2022-06-06 DIAGNOSIS — E039 Hypothyroidism, unspecified: Secondary | ICD-10-CM

## 2022-06-16 ENCOUNTER — Other Ambulatory Visit: Payer: Self-pay | Admitting: Nurse Practitioner

## 2022-06-16 DIAGNOSIS — N390 Urinary tract infection, site not specified: Secondary | ICD-10-CM

## 2022-06-23 ENCOUNTER — Other Ambulatory Visit: Payer: Self-pay | Admitting: Nurse Practitioner

## 2022-06-23 DIAGNOSIS — F5101 Primary insomnia: Secondary | ICD-10-CM

## 2022-07-08 ENCOUNTER — Ambulatory Visit: Payer: 59 | Admitting: Nurse Practitioner

## 2022-07-14 NOTE — Progress Notes (Unsigned)
Established patient visit   Patient: Grace Christensen   DOB: [birth year]   61 y.o. Female  MRN: [id number] Visit Date: 07/15/2022   No chief complaint on file.  Subjective    HPI  Routine follow up -mood - generalized anxiety --currently takes sertraline and bupropion everyday.  --has prn dose alprazolam 0.25 mg daily if needed  --does take trazodone as needed to help with insomnia  -hypertension -hypothyroid    Medications: Outpatient Medications Prior to Visit  Medication Sig   ALPRAZolam (XANAX) 0.25 MG tablet Take 1 tablet (0.25 mg total) by mouth 2 (two) times daily as needed for anxiety.   B Complex Vitamins (B COMPLEX PO) Place 1 mL under the tongue daily.   Biotin 1000 MCG tablet Take 1,000 mcg by mouth daily.   bisoprolol (ZEBETA) 10 MG tablet Take 1 tablet (10 mg total) by mouth at bedtime. **PLEASE CONTACT OUR OFFICE TO SCHEDULE A FOLLOW UP FOR FUTURE MED REFILLS**   buPROPion (WELLBUTRIN XL) 300 MG 24 hr tablet TAKE 1 TABLET BY MOUTH IN  THE MORNING   Calcium Carb-Cholecalciferol 600-800 MG-UNIT TABS Take 1 tablet by mouth daily.   Cholecalciferol (VITAMIN D) 2000 units tablet Take 2,000 Units by mouth daily.   fexofenadine (ALLEGRA) 180 MG tablet Take 180 mg by mouth every morning. Allergy Relief   levothyroxine (SYNTHROID) 75 MCG tablet TAKE 1 TABLET BY MOUTH DAILY   nitrofurantoin, macrocrystal-monohydrate, (MACROBID) 100 MG capsule TAKE 1 CAPSULE BY MOUTH DAILY  FOR RECURRENT URINARY TRACT  INFECTION   Omega-3 Fatty Acids (OMEGA-3 FISH OIL) 1200 MG CAPS Take 1-2 capsules by mouth See admin instructions. 1 capsule in the morning, and 2 capsules in the evening   Potassium 99 MG TABS Take 1 tablet by mouth daily.   sertraline (ZOLOFT) 100 MG tablet TAKE 2 TABLETS BY MOUTH  DAILY   traZODone (DESYREL) 100 MG tablet TAKE 3 TABLETS BY MOUTH AT  BEDTIME AS NEEDED   vitamin B-12 (CYANOCOBALAMIN) 1000 MCG tablet Take 1,000 mcg by mouth daily.   No facility-administered  medications prior to visit.    Review of Systems  {Labs (Optional):23779}   Objective    There were no vitals taken for this visit. BP Readings from Last 3 Encounters:  04/07/22 120/76  11/14/21 116/69  09/19/21 104/67    Wt Readings from Last 3 Encounters:  04/07/22 187 lb 12.8 oz (85.2 kg)  11/14/21 185 lb 8 oz (84.1 kg)  09/19/21 182 lb (82.6 kg)    Physical Exam  ***  No results found for any visits on 07/15/22.  Assessment & Plan     Problem List Items Addressed This Visit   None    No follow-ups on file.         Ronnell Freshwater, NP  [facility] [phone] (phone) [phone] (fax)  [place]

## 2022-07-15 ENCOUNTER — Encounter: Payer: Self-pay | Admitting: Nurse Practitioner

## 2022-07-15 ENCOUNTER — Ambulatory Visit: Payer: 59 | Admitting: Nurse Practitioner

## 2022-07-15 VITALS — BP 123/75 | HR 60 | Ht 64.96 in | Wt 184.4 lb

## 2022-07-15 DIAGNOSIS — R5383 Other fatigue: Secondary | ICD-10-CM

## 2022-07-15 DIAGNOSIS — Z23 Encounter for immunization: Secondary | ICD-10-CM | POA: Diagnosis not present

## 2022-07-15 DIAGNOSIS — Z6830 Body mass index (BMI) 30.0-30.9, adult: Secondary | ICD-10-CM

## 2022-07-15 DIAGNOSIS — N390 Urinary tract infection, site not specified: Secondary | ICD-10-CM | POA: Diagnosis not present

## 2022-07-15 DIAGNOSIS — F411 Generalized anxiety disorder: Secondary | ICD-10-CM

## 2022-07-15 DIAGNOSIS — E039 Hypothyroidism, unspecified: Secondary | ICD-10-CM | POA: Diagnosis not present

## 2022-07-15 DIAGNOSIS — F5101 Primary insomnia: Secondary | ICD-10-CM

## 2022-07-15 DIAGNOSIS — I10 Essential (primary) hypertension: Secondary | ICD-10-CM | POA: Diagnosis not present

## 2022-07-15 LAB — POCT URINALYSIS DIP (CLINITEK)
Bilirubin, UA: NEGATIVE
Blood, UA: NEGATIVE
Glucose, UA: NEGATIVE mg/dL
Ketones, POC UA: NEGATIVE mg/dL
Leukocytes, UA: NEGATIVE
Nitrite, UA: NEGATIVE
POC PROTEIN,UA: NEGATIVE
Spec Grav, UA: 1.015 (ref 1.010–1.025)
Urobilinogen, UA: 0.2 E.U./dL
pH, UA: 7.5 (ref 5.0–8.0)

## 2022-07-15 MED ORDER — TRAZODONE HCL 100 MG PO TABS: ORAL_TABLET | ORAL | 1 refills | Status: DC

## 2022-07-15 MED ORDER — BISOPROLOL FUMARATE 10 MG PO TABS: 10.0000 mg | ORAL_TABLET | Freq: Every day | ORAL | 1 refills | Status: DC

## 2022-07-16 LAB — CBC
Hematocrit: 43.8 % (ref 34.0–46.6)
Hemoglobin: 15.1 g/dL (ref 11.1–15.9)
MCH: 31.7 pg (ref 26.6–33.0)
MCHC: 34.5 g/dL (ref 31.5–35.7)
MCV: 92 fL (ref 79–97)
Platelets: 170 10*3/uL (ref 150–450)
RBC: 4.77 x10E6/uL (ref 3.77–5.28)
RDW: 12.6 % (ref 11.7–15.4)
WBC: 5.4 10*3/uL (ref 3.4–10.8)

## 2022-07-16 LAB — TSH+FREE T4
Free T4: 1.59 ng/dL (ref 0.82–1.77)
TSH: 2.04 u[IU]/mL (ref 0.450–4.500)

## 2022-07-16 LAB — B12 AND FOLATE PANEL
Folate: 12.5 ng/mL (ref >3.0)
Vitamin B-12: >2000 pg/mL — ABNORMAL HIGH (ref 232–1245)

## 2022-07-21 ENCOUNTER — Encounter: Payer: Self-pay | Admitting: Nurse Practitioner

## 2022-07-23 ENCOUNTER — Encounter: Payer: Self-pay | Admitting: Nurse Practitioner

## 2022-08-03 ENCOUNTER — Other Ambulatory Visit: Payer: Self-pay | Admitting: Physician Assistant

## 2022-08-03 DIAGNOSIS — E039 Hypothyroidism, unspecified: Secondary | ICD-10-CM

## 2022-09-24 ENCOUNTER — Encounter: Payer: Self-pay | Admitting: Nurse Practitioner

## 2022-09-25 ENCOUNTER — Other Ambulatory Visit: Payer: Self-pay | Admitting: Nurse Practitioner

## 2022-09-25 DIAGNOSIS — F5101 Primary insomnia: Secondary | ICD-10-CM

## 2022-09-25 MED ORDER — TRAZODONE HCL 100 MG PO TABS: ORAL_TABLET | ORAL | 1 refills | Status: DC

## 2022-09-25 NOTE — Progress Notes (Signed)
Adjusted prescription of trazodone 100 mg to take 3 tablets at bedtime as needed. Sent new prescription to [facility]

## 2022-10-21 ENCOUNTER — Other Ambulatory Visit: Payer: Self-pay | Admitting: Physician Assistant

## 2022-10-21 DIAGNOSIS — E039 Hypothyroidism, unspecified: Secondary | ICD-10-CM

## 2022-11-10 ENCOUNTER — Telehealth: Payer: Self-pay | Admitting: *Deleted

## 2022-11-10 NOTE — Telephone Encounter (Signed)
LVM for pt to call office to get her to come in Friday at 9:50am for her appointment.  She is scheduled for a long physical and it needs to be a 40 minute appointment, will also send a MyChart message informing her of the time. Kayliana Codd Zimmerman Rumple, CMA

## 2022-11-11 ENCOUNTER — Other Ambulatory Visit: Payer: Self-pay | Admitting: Nurse Practitioner

## 2022-11-11 DIAGNOSIS — F411 Generalized anxiety disorder: Secondary | ICD-10-CM

## 2022-11-11 NOTE — Telephone Encounter (Signed)
Called pt and asked her to come in early for her appointment and she said she would be here. Grace Christensen, CMA

## 2022-11-13 NOTE — Progress Notes (Signed)
Complete physical exam   Patient: Grace Christensen   DOB: 1961/06/10   61 y.o. Female  MRN: [id number] Visit Date: 11/14/2022    Chief Complaint  Patient presents with   Annual Exam   Gynecologic Exam   Subjective    Grace Christensen is a 61 y.o. female who presents today for a complete physical exam.  She reports consuming a  generally healthy  diet. The patient does not participate in regular exercise at present. She generally feels fairly well. She does have additional problems to discuss today.   HPI  Annual physical -will get pap smear today  --has been HPV positive and negative for lesion or malignancy  -GAD -difficulty sleeping  -hypothyroid -recent thyroid panel normal  -has had GI virus for past 4 days  --nausea, vomiting, diarrhea, and low grade fever.  --testing for COVID 19 negative on Wednesday and Thursday. -does have complaint of fatigue.  --stopped taking B12 after recent labs showed B12 level > 2000.   Past Medical History:  Diagnosis Date   Anemia    H/O YEARS AGO   Cancer ([place])    SKIN CANCER ON FACE   GERD (gastroesophageal reflux disease)    Hypertension    Hypothyroidism    Thyroid disease    Phreesia 01/21/2021   Past Surgical History:  Procedure Laterality Date   COLONOSCOPY     FUNCTIONAL ENDOSCOPIC SINUS SURGERY     KNEE ARTHROSCOPY Right 03/04/2017   Procedure: ARTHROSCOPY KNEE, REMOVAL OF LOOSE BODY. CHONDROPLASTY MEDIAL COMPARTMENT;  Surgeon: Dereck Leep, MD;  Location: [facility] OR[facility];  Service: Orthopedics;  Laterality: Right;   NOVASURE ABLATION     TUBAL LIGATION     Social History   Socioeconomic History   Marital status: Married    Spouse name: Not on file   Number of children: Not on file   Years of education: Not on file   Highest education level: Not on file  Occupational History   Occupation: Arboriculturist    Employer: [employer]  Tobacco Use   Smoking status: Former    Packs/day: 1.00    Years: 15.00    Total pack years:  15.00    Types: Cigarettes    Quit date: 07/27/2009    Years since quitting: 13.3   Smokeless tobacco: Never  Substance and Sexual Activity   Alcohol use: Yes    Comment: OCC WEEKEND    Drug use: No   Sexual activity: Yes  Other Topics Concern   Not on file  Social History Narrative   Not on file   Social Determinants of Health   Financial Resource Strain: Not on file  Food Insecurity: Not on file  Transportation Needs: Not on file  Physical Activity: Not on file  Stress: Not on file  Social Connections: Not on file  Intimate Partner Violence: Not on file   Family Status  Relation Name Status   Mother  (Not Specified)   Father  (Not Specified)   Family History  Problem Relation Age of Onset   Cancer Mother    Hypertension Mother    Prostate cancer Father    Allergies  Allergen Reactions   Other     Patient Care Team: Ronnell Freshwater, NP as PCP - General (Family Medicine)   Medications: Outpatient Medications Prior to Visit  Medication Sig   ALPRAZolam (XANAX) 0.25 MG tablet Take 1 tablet (0.25 mg total) by mouth 2 (two) times daily as needed for anxiety.  B Complex Vitamins (B COMPLEX PO) Place 1 mL under the tongue daily.   Biotin 1000 MCG tablet Take 1,000 mcg by mouth daily.   bisoprolol (ZEBETA) 10 MG tablet Take 1 tablet (10 mg total) by mouth at bedtime. **PLEASE CONTACT OUR OFFICE TO SCHEDULE A FOLLOW UP FOR FUTURE MED REFILLS**   buPROPion (WELLBUTRIN XL) 300 MG 24 hr tablet TAKE 1 TABLET BY MOUTH IN  THE MORNING   Calcium Carb-Cholecalciferol 600-800 MG-UNIT TABS Take 1 tablet by mouth daily.   Cholecalciferol (VITAMIN D) 2000 units tablet Take 2,000 Units by mouth daily.   fexofenadine (ALLEGRA) 180 MG tablet Take 180 mg by mouth every morning. Allergy Relief   levothyroxine (SYNTHROID) 75 MCG tablet TAKE 1 TABLET BY MOUTH DAILY   nitrofurantoin, macrocrystal-monohydrate, (MACROBID) 100 MG capsule TAKE 1 CAPSULE BY MOUTH DAILY  FOR RECURRENT URINARY  TRACT  INFECTION   Omega-3 Fatty Acids (OMEGA-3 FISH OIL) 1200 MG CAPS Take 1-2 capsules by mouth See admin instructions. 1 capsule in the morning, and 2 capsules in the evening   Potassium 99 MG TABS Take 1 tablet by mouth daily.   sertraline (ZOLOFT) 100 MG tablet TAKE 2 TABLETS BY MOUTH DAILY   traZODone (DESYREL) 100 MG tablet Take 3 tablets po QHS prn insomnnia   vitamin B-12 (CYANOCOBALAMIN) 1000 MCG tablet Take 1,000 mcg by mouth daily.   No facility-administered medications prior to visit.    Review of Systems  Constitutional:  Negative for activity change, appetite change, chills, fatigue and fever.  HENT:  Negative for congestion, postnasal drip, rhinorrhea, sinus pressure, sinus pain, sneezing and sore throat.   Eyes: Negative.   Respiratory:  Negative for cough, chest tightness, shortness of breath and wheezing.   Cardiovascular:  Negative for chest pain and palpitations.  Gastrointestinal:  Negative for abdominal pain, constipation, diarrhea, nausea and vomiting.  Endocrine: Negative for cold intolerance, heat intolerance, polydipsia and polyuria.       Well managed on current dose of levothyroxine.   Genitourinary:  Negative for dyspareunia, dysuria, flank pain, frequency and urgency.  Musculoskeletal:  Negative for arthralgias, back pain and myalgias.  Skin:  Negative for rash.  Allergic/Immunologic: Negative for environmental allergies.  Neurological:  Negative for dizziness, weakness and headaches.  Hematological:  Negative for adenopathy.  Psychiatric/Behavioral:  Positive for dysphoric mood and sleep disturbance. The patient is nervous/anxious.     Last CBC Lab Results  Component Value Date   WBC 5.4 07/15/2022   HGB 15.1 07/15/2022   HCT 43.8 07/15/2022   MCV 92 07/15/2022   MCH 31.7 07/15/2022   RDW 12.6 07/15/2022   PLT 170 31/49/7026   Last metabolic panel Lab Results  Component Value Date   GLUCOSE 78 08/14/2021   NA 139 08/14/2021   K 4.7  08/14/2021   CL 99 08/14/2021   CO2 29 08/14/2021   BUN 8 08/14/2021   CREATININE 0.63 08/14/2021   EGFR 101 08/14/2021   CALCIUM 10.2 08/14/2021   PROT 7.2 01/03/2021   ALBUMIN 4.4 01/03/2021   LABGLOB 2.8 01/03/2021   AGRATIO 1.6 01/03/2021   BILITOT 0.4 01/03/2021   ALKPHOS 75 01/03/2021   AST 15 01/03/2021   ALT 13 01/03/2021   Last lipids Lab Results  Component Value Date   CHOL 215 (H) 01/03/2021   HDL 49 01/03/2021   LDLCALC 140 (H) 01/03/2021   TRIG 146 01/03/2021   Last thyroid functions Lab Results  Component Value Date   TSH 2.040 07/15/2022  T3TOTAL 167 01/28/2019   Last vitamin D Lab Results  Component Value Date   VD25OH 66.1 01/28/2019       Objective     Today's Vitals   11/14/22 1001 11/14/22 1036  BP: (Abnormal) 148/83 (Abnormal) 152/89  Pulse: 64   SpO2: 96%   Weight: 181 lb (82.1 kg)   Height: 5' 4.96" (1.65 m)    Body mass index is 30.16 kg/m.  BP Readings from Last 3 Encounters:  11/14/22 (Abnormal) 152/89  07/15/22 123/75  04/07/22 120/76    Wt Readings from Last 3 Encounters:  11/14/22 181 lb (82.1 kg)  07/15/22 184 lb 6.4 oz (83.6 kg)  04/07/22 187 lb 12.8 oz (85.2 kg)     Physical Exam Vitals and nursing note reviewed. Exam conducted with a chaperone present.  Constitutional:      Appearance: Normal appearance. She is well-developed.  HENT:     Head: Normocephalic and atraumatic.     Right Ear: Tympanic membrane, ear canal and external ear normal.     Left Ear: Tympanic membrane, ear canal and external ear normal.     Nose: Nose normal.     Mouth/Throat:     Mouth: Mucous membranes are moist.     Pharynx: Oropharynx is clear.  Eyes:     Extraocular Movements: Extraocular movements intact.     Conjunctiva/sclera: Conjunctivae normal.     Pupils: Pupils are equal, round, and reactive to light.  Neck:     Vascular: No carotid bruit.  Cardiovascular:     Rate and Rhythm: Normal rate and regular rhythm.      Pulses: Normal pulses.     Heart sounds: Normal heart sounds.  Pulmonary:     Effort: Pulmonary effort is normal.     Breath sounds: Normal breath sounds.  Chest:  Breasts:    Right: Normal. No swelling, bleeding, inverted nipple, mass, nipple discharge, skin change or tenderness.     Left: Normal. No swelling, bleeding, inverted nipple, mass, nipple discharge, skin change or tenderness.  Abdominal:     General: Bowel sounds are normal. There is no distension.     Palpations: Abdomen is soft. There is no mass.     Tenderness: There is no abdominal tenderness. There is no right CVA tenderness, left CVA tenderness, guarding or rebound.     Hernia: No hernia is present. There is no hernia in the left inguinal area or right inguinal area.  Genitourinary:    General: Normal vulva.     Exam position: Supine.     Labia:        Right: No rash, tenderness, lesion or injury.        Left: No rash, tenderness, lesion or injury.      Vagina: Normal. No signs of injury and foreign body. No vaginal discharge, erythema, tenderness, bleeding, lesions or prolapsed vaginal walls.     Cervix: No cervical motion tenderness, discharge, friability, lesion, erythema, cervical bleeding or eversion.     Uterus: Normal. Not deviated, not enlarged, not fixed and not tender.      Adnexa:        Right: No mass, tenderness or fullness.         Left: No mass, tenderness or fullness.       Comments: No tenderness, masses, or organomeglay present during bimanual exam .   Musculoskeletal:        General: Normal range of motion.     Cervical back: Normal range of  motion and neck supple.  Lymphadenopathy:     Cervical: No cervical adenopathy.     Upper Body:     Right upper body: No axillary adenopathy.     Left upper body: No axillary adenopathy.     Lower Body: No right inguinal adenopathy. No left inguinal adenopathy.  Skin:    General: Skin is warm and dry.     Capillary Refill: Capillary refill takes less  than 2 seconds.  Neurological:     General: No focal deficit present.     Mental Status: She is alert and oriented to person, place, and time.  Psychiatric:        Mood and Affect: Mood normal.        Behavior: Behavior normal.        Thought Content: Thought content normal.        Judgment: Judgment normal.       Last depression screening scores   Row Labels 11/14/2022   10:05 AM 07/15/2022    9:53 AM 04/07/2022    1:18 PM  PHQ 2/9 Scores   Section Header. No data exists in this row.     PHQ - 2 Score   0 0 0  PHQ- 9 Score   _0 Last fall risk screening   Row Labels 11/14/2021   10:54 AM  Fall Risk    Section Header. No data exists in this row.   Falls in the past year?   0  Number falls in past yr:   0  Injury with Fall?   0  Follow up   Falls evaluation completed   Last Audit-C alcohol use screening   Row Labels 12/24/2020    2:51 PM  Alcohol Use Disorder Test (AUDIT)   Section Header. No data exists in this row.   1. How often do you have a drink containing alcohol?   1  2. How many drinks containing alcohol do you have on a typical day when you are drinking?   0   A score of 3 or more in women, and 4 or more in men indicates increased risk for alcohol abuse, EXCEPT if all of the points are from question 1   Results for orders placed or performed in visit on 11/14/22  Cytology - PAP( [employer])  Result Value Ref Range   High risk HPV Negative    Adequacy      Satisfactory for evaluation; transformation zone component ABSENT.   Diagnosis      - Negative for intraepithelial lesion or malignancy (NILM)   Comment Normal Reference Range HPV - Negative     Assessment & Plan    1. Well woman exam Annual physical with pap smear today  - Cytology - PAP( [place])  2. Primary hypertension Bp elevated today, but generally stable. No changes made to medication at this time. She will follow DASH diet and monitor her blood pressure at home.   3. Acquired  hypothyroidism Continue levothyroxine as prescribed.   4. Major depressive disorder in partial remission, unspecified whether recurrent (HCC) Continue  sertraline and wellbutrin as prescribed. Will refill as indicated.   5. Generalized anxiety disorder May take alprazolam 0.25 mg twice daily as needed for acute anxiety. Will refill as indicated.   6. Primary insomnia Ay take trazodone as previously prescribed for acute insomnia.       Immunization History  Administered Date(s) Administered   Influenza,inj,Quad PF,6+ Mos 09/26/2018, 08/14/2021  Influenza-Unspecified 08/25/2017, 11/09/2020   Moderna SARS-COV2 Booster Vaccination 08/11/2021   Moderna Sars-Covid-2 Vaccination 08/11/2021   PFIZER(Purple Top)SARS-COV-2 Vaccination 02/16/2020, 03/13/2020, 11/09/2020   Tdap 04/07/2022   Zoster Recombinat (Shingrix) 07/15/2022    Health Maintenance  Topic Date Due   HIV Screening  Never done   INFLUENZA VACCINE  07/01/2022   COVID-19 Vaccine (6 - 2023-24 season) 08/01/2022   Zoster Vaccines- Shingrix (2 of 2) 09/09/2022   MAMMOGRAM  10/04/2023   COLONOSCOPY (Pts 45-50yr Insurance coverage will need to be confirmed)  08/27/2025   PAP SMEAR-Modifier  11/14/2025   DTaP/Tdap/Td (2 - Td or Tdap) 04/07/2032   Hepatitis C Screening  Completed   HPV VACCINES  Aged Out    Discussed health benefits of physical activity, and encouraged her to engage in regular exercise appropriate for her age and condition.  Problem List Items Addressed This Visit       Cardiovascular and Mediastinum   Hypertension     Endocrine   Acquired hypothyroidism     Other   Generalized anxiety disorder   Primary insomnia   Major depressive disorder in partial remission, unspecified whether recurrent ([place]   Other Visit Diagnoses     Well woman exam    -  Primary   Relevant Orders   Cytology - PAP( [place]) (Completed)        Return in about 4 months (around 03/16/2023) for blood pressure,  mood - will need to make appt for flu and shingles vacc.        HRonnell Freshwater NP  [phone](phone) [phone](fax)  [place]

## 2022-11-14 ENCOUNTER — Other Ambulatory Visit (HOSPITAL_COMMUNITY)
Admission: RE | Admit: 2022-11-14 | Discharge: 2022-11-14 | Disposition: A | Discharge status: Home / Self Care | Payer: 59 | Source: Ambulatory Visit | Attending: Nurse Practitioner | Admitting: Nurse Practitioner

## 2022-11-14 ENCOUNTER — Ambulatory Visit: Payer: 59 | Admitting: Nurse Practitioner

## 2022-11-14 ENCOUNTER — Ambulatory Visit (INDEPENDENT_AMBULATORY_CARE_PROVIDER_SITE_OTHER): Payer: 59 | Admitting: Nurse Practitioner

## 2022-11-14 ENCOUNTER — Encounter: Payer: Self-pay | Admitting: Nurse Practitioner

## 2022-11-14 VITALS — BP 152/89 | HR 64 | Ht 64.96 in | Wt 181.0 lb

## 2022-11-14 DIAGNOSIS — E039 Hypothyroidism, unspecified: Secondary | ICD-10-CM

## 2022-11-14 DIAGNOSIS — F411 Generalized anxiety disorder: Secondary | ICD-10-CM

## 2022-11-14 DIAGNOSIS — Z01419 Encounter for gynecological examination (general) (routine) without abnormal findings: Secondary | ICD-10-CM | POA: Diagnosis present

## 2022-11-14 DIAGNOSIS — I10 Essential (primary) hypertension: Secondary | ICD-10-CM

## 2022-11-14 DIAGNOSIS — F324 Major depressive disorder, single episode, in partial remission: Secondary | ICD-10-CM

## 2022-11-14 DIAGNOSIS — F5101 Primary insomnia: Secondary | ICD-10-CM

## 2022-11-19 ENCOUNTER — Encounter: Payer: Self-pay | Admitting: Nurse Practitioner

## 2022-11-19 LAB — CYTOLOGY - PAP
Adequacy: ABSENT
Comment: NEGATIVE
Diagnosis: NEGATIVE
High risk HPV: NEGATIVE

## 2022-12-03 ENCOUNTER — Other Ambulatory Visit: Payer: Self-pay | Admitting: Nurse Practitioner

## 2022-12-03 DIAGNOSIS — I10 Essential (primary) hypertension: Secondary | ICD-10-CM

## 2022-12-08 ENCOUNTER — Other Ambulatory Visit: Payer: Self-pay | Admitting: Nurse Practitioner

## 2022-12-08 DIAGNOSIS — Z1231 Encounter for screening mammogram for malignant neoplasm of breast: Secondary | ICD-10-CM

## 2022-12-08 DIAGNOSIS — F411 Generalized anxiety disorder: Secondary | ICD-10-CM

## 2022-12-08 MED ORDER — ALPRAZOLAM 0.25 MG PO TABS: 0.2500 mg | ORAL_TABLET | Freq: Two times a day (BID) | ORAL | 3 refills | Status: DC | PRN

## 2022-12-14 ENCOUNTER — Other Ambulatory Visit: Payer: Self-pay | Admitting: Nurse Practitioner

## 2022-12-14 DIAGNOSIS — N390 Urinary tract infection, site not specified: Secondary | ICD-10-CM

## 2022-12-15 NOTE — Telephone Encounter (Signed)
Pt is requesting a refill on: nitrofurantoin, macrocrystal-monohydrate, (MACROBID) 100 MG capsule   Pharmacy: [facility] ([place], [address] [place]    Pt takes this on a daily basis.   LOV 1215/24 ROV 03/16/23

## 2022-12-22 ENCOUNTER — Other Ambulatory Visit: Payer: Self-pay | Admitting: Nurse Practitioner

## 2022-12-22 DIAGNOSIS — N390 Urinary tract infection, site not specified: Secondary | ICD-10-CM

## 2022-12-28 ENCOUNTER — Other Ambulatory Visit: Payer: Self-pay | Admitting: Nurse Practitioner

## 2022-12-28 DIAGNOSIS — F411 Generalized anxiety disorder: Secondary | ICD-10-CM

## 2022-12-28 DIAGNOSIS — E039 Hypothyroidism, unspecified: Secondary | ICD-10-CM

## 2023-03-05 ENCOUNTER — Other Ambulatory Visit: Payer: Self-pay | Admitting: Nurse Practitioner

## 2023-03-05 DIAGNOSIS — F5101 Primary insomnia: Secondary | ICD-10-CM

## 2023-03-16 ENCOUNTER — Other Ambulatory Visit: Payer: Self-pay | Admitting: Nurse Practitioner

## 2023-03-16 ENCOUNTER — Encounter: Payer: 59 | Admitting: Nurse Practitioner

## 2023-03-16 ENCOUNTER — Telehealth: Payer: Self-pay

## 2023-03-16 DIAGNOSIS — J069 Acute upper respiratory infection, unspecified: Secondary | ICD-10-CM

## 2023-03-16 MED ORDER — MOLNUPIRAVIR EUA 200MG CAPSULE: 4.0000 | ORAL_CAPSULE | Freq: Two times a day (BID) | ORAL | 0 refills | Status: AC

## 2023-03-16 NOTE — Telephone Encounter (Signed)
Pt canceled appt today COVID POS. Pt is wanting to know if there is anything that could be called in for COVID. Pt didn't disclosed what symptom she was having.  Please advise

## 2023-03-16 NOTE — Telephone Encounter (Signed)
Please let the patient know that I sent prescription for molnupiravir which is antiviral used to manage COVID. She should take 4 pills twice daily for the next 5 days. She should also take OTC zinc, vitamin d, and vitamin c every day. Can also use otc medications as needed and as indicated for symptom management. Thanks so much.   -HB

## 2023-03-16 NOTE — Telephone Encounter (Signed)
I called pt to let her know. Pt agrees to start treatment!

## 2023-03-16 NOTE — Progress Notes (Signed)
Established patient visit   Patient: Grace Christensen   DOB: [birth year]   62 y.o. Female  MRN: [id number] Visit Date: 03/16/2023   No chief complaint on file.  Subjective    HPI  Follow up  -blood pressure.  --generally well managed.  -generalized anxiety --stable with current medications  --likely needs refills --?shingles and flu vaccines.    Medications: Outpatient Medications Prior to Visit  Medication Sig   ALPRAZolam (XANAX) 0.25 MG tablet Take 1 tablet (0.25 mg total) by mouth 2 (two) times daily as needed for anxiety.   B Complex Vitamins (B COMPLEX PO) Place 1 mL under the tongue daily.   Biotin 1000 MCG tablet Take 1,000 mcg by mouth daily.   bisoprolol (ZEBETA) 10 MG tablet TAKE 1 TABLET BY MOUTH AT  BEDTIME   buPROPion (WELLBUTRIN XL) 300 MG 24 hr tablet TAKE 1 TABLET BY MOUTH IN THE  MORNING   Calcium Carb-Cholecalciferol 600-800 MG-UNIT TABS Take 1 tablet by mouth daily.   Cholecalciferol (VITAMIN D) 2000 units tablet Take 2,000 Units by mouth daily.   fexofenadine (ALLEGRA) 180 MG tablet Take 180 mg by mouth every morning. Allergy Relief   levothyroxine (SYNTHROID) 75 MCG tablet TAKE 1 TABLET BY MOUTH DAILY   nitrofurantoin, macrocrystal-monohydrate, (MACROBID) 100 MG capsule TAKE 1 CAPSULE BY MOUTH DAILY  FOR RECURRENT URINARY TRACT  INFECTION   Omega-3 Fatty Acids (OMEGA-3 FISH OIL) 1200 MG CAPS Take 1-2 capsules by mouth See admin instructions. 1 capsule in the morning, and 2 capsules in the evening   Potassium 99 MG TABS Take 1 tablet by mouth daily.   sertraline (ZOLOFT) 100 MG tablet TAKE 2 TABLETS BY MOUTH DAILY   traZODone (DESYREL) 100 MG tablet TAKE 3 TABLETS BY MOUTH EVERY  NIGHT AT BEDTIME AS NEEDED FOR  INSOMNNIA   vitamin B-12 (CYANOCOBALAMIN) 1000 MCG tablet Take 1,000 mcg by mouth daily.   No facility-administered medications prior to visit.    Review of Systems     Objective    There were no vitals filed for this visit. There is no height  or weight on file to calculate BMI.  BP Readings from Last 3 Encounters:  11/14/22 (Abnormal) 152/89  07/15/22 123/75  04/07/22 120/76    Wt Readings from Last 3 Encounters:  11/14/22 181 lb (82.1 kg)  07/15/22 184 lb 6.4 oz (83.6 kg)  04/07/22 187 lb 12.8 oz (85.2 kg)    Physical Exam    No results found for any visits on 03/16/23.  Assessment & Plan    There are no diagnoses linked to this encounter.   No follow-ups on file.         Carlean Jews, NP  [facility] at [facility] [phone] (phone) [phone] (fax)  [facility]

## 2023-06-21 ENCOUNTER — Telehealth: Payer: Self-pay | Admitting: Nurse Practitioner

## 2023-06-21 DIAGNOSIS — F411 Generalized anxiety disorder: Secondary | ICD-10-CM

## 2023-06-22 NOTE — Telephone Encounter (Signed)
Pt doesn't have any insurance at this time. She is trying to sort through the insurance issue and will call back when she has more information. I advised her the fee for self pay.

## 2023-06-22 NOTE — Telephone Encounter (Signed)
Ok thanks 

## 2023-09-23 ENCOUNTER — Other Ambulatory Visit: Payer: Self-pay | Admitting: Nurse Practitioner

## 2023-09-23 DIAGNOSIS — F411 Generalized anxiety disorder: Secondary | ICD-10-CM

## 2023-10-29 ENCOUNTER — Other Ambulatory Visit: Payer: Self-pay | Admitting: Nurse Practitioner

## 2023-10-29 DIAGNOSIS — I10 Essential (primary) hypertension: Secondary | ICD-10-CM

## 2023-11-13 ENCOUNTER — Other Ambulatory Visit: Payer: Self-pay | Admitting: Nurse Practitioner

## 2023-11-13 DIAGNOSIS — F411 Generalized anxiety disorder: Secondary | ICD-10-CM

## 2023-12-24 ENCOUNTER — Encounter: Payer: Self-pay | Admitting: Nurse Practitioner

## 2023-12-24 ENCOUNTER — Ambulatory Visit: Payer: 59 | Admitting: Nurse Practitioner

## 2023-12-24 VITALS — BP 138/82 | HR 73 | Temp 97.9°F | Resp 16 | Ht 64.0 in | Wt 185.6 lb

## 2023-12-24 DIAGNOSIS — N390 Urinary tract infection, site not specified: Secondary | ICD-10-CM

## 2023-12-24 DIAGNOSIS — E039 Hypothyroidism, unspecified: Secondary | ICD-10-CM

## 2023-12-24 DIAGNOSIS — R300 Dysuria: Secondary | ICD-10-CM

## 2023-12-24 DIAGNOSIS — J0110 Acute frontal sinusitis, unspecified: Secondary | ICD-10-CM

## 2023-12-24 DIAGNOSIS — Z79899 Other long term (current) drug therapy: Secondary | ICD-10-CM

## 2023-12-24 DIAGNOSIS — F411 Generalized anxiety disorder: Secondary | ICD-10-CM

## 2023-12-24 DIAGNOSIS — I10 Essential (primary) hypertension: Secondary | ICD-10-CM

## 2023-12-24 DIAGNOSIS — R319 Hematuria, unspecified: Secondary | ICD-10-CM

## 2023-12-24 MED ORDER — LEVOTHYROXINE SODIUM 75 MCG PO TABS: 75.0000 ug | ORAL_TABLET | Freq: Every day | ORAL | 3 refills | Status: DC

## 2023-12-24 MED ORDER — SERTRALINE HCL 100 MG PO TABS: 200.0000 mg | ORAL_TABLET | Freq: Every day | ORAL | 3 refills | Status: DC

## 2023-12-24 MED ORDER — BUPROPION HCL ER (XL) 300 MG PO TB24: 300.0000 mg | ORAL_TABLET | Freq: Every morning | ORAL | 3 refills | Status: DC

## 2023-12-24 MED ORDER — BISOPROLOL FUMARATE 10 MG PO TABS: 10.0000 mg | ORAL_TABLET | Freq: Every day | ORAL | 3 refills | Status: DC

## 2023-12-24 MED ORDER — AMOXICILLIN-POT CLAVULANATE 875-125 MG PO TABS: 1.0000 | ORAL_TABLET | Freq: Two times a day (BID) | ORAL | 0 refills | Status: AC

## 2023-12-24 NOTE — Progress Notes (Signed)
[facility] [address] [place], [place] [address]  Internal MEDICINE  Office Visit Note  Patient Name: Grace Christensen  [id number]  [id number]  Date of Service: 12/24/2023  Chief Complaint  Patient presents with   Acute Visit   Sinus Problem    Possible sinus infection, no fever/body aches   Urinary Tract Infection     HPI [name] presents for an acute sick visit for possible sinusitis and UTI. Also due for refills.  --onset of sinusitis symptoms was about 2 weeks ago.  --negative for covid --reports nasal congestion, dry nasal passages, nosebleeds, mouth pain, sinus pressure, cough, fatigue, headaches --Possible UTI -- urgency, frequency, suprapubic pressure, back pain. Urinalysis was done, positive for trace blood, negative for nitrites and leukocytes.  --also due for refills on all medications.  --also due for routine labs and annual physical for her next office visit.        Current Medication:  Outpatient Encounter Medications as of 12/24/2023  Medication Sig   ALPRAZolam (XANAX) 0.25 MG tablet Take 1 tablet (0.25 mg total) by mouth 2 (two) times daily as needed for anxiety.   amoxicillin-clavulanate (AUGMENTIN) 875-125 MG tablet Take 1 tablet by mouth 2 (two) times daily for 10 days. Take with food.   B Complex Vitamins (B COMPLEX PO) Place 1 mL under the tongue daily.   Biotin 1000 MCG tablet Take 1,000 mcg by mouth daily.   Calcium Carb-Cholecalciferol 600-800 MG-UNIT TABS Take 1 tablet by mouth daily.   Cholecalciferol (VITAMIN D) 2000 units tablet Take 2,000 Units by mouth daily.   fexofenadine (ALLEGRA) 180 MG tablet Take 180 mg by mouth every morning. Allergy Relief   Omega-3 Fatty Acids (OMEGA-3 FISH OIL) 1200 MG CAPS Take 1-2 capsules by mouth See admin instructions. 1 capsule in the morning, and 2 capsules in the evening   Potassium 99 MG TABS Take 1 tablet by mouth daily.   traZODone (DESYREL) 100 MG tablet TAKE 3 TABLETS BY MOUTH EVERY  NIGHT  AT BEDTIME AS NEEDED FOR  INSOMNNIA   vitamin B-12 (CYANOCOBALAMIN) 1000 MCG tablet Take 1,000 mcg by mouth daily.   [DISCONTINUED] bisoprolol (ZEBETA) 10 MG tablet TAKE 1 TABLET BY MOUTH AT  BEDTIME   [DISCONTINUED] buPROPion (WELLBUTRIN XL) 300 MG 24 hr tablet TAKE 1 TABLET BY MOUTH IN THE  MORNING   [DISCONTINUED] levothyroxine (SYNTHROID) 75 MCG tablet TAKE 1 TABLET BY MOUTH DAILY   [DISCONTINUED] nitrofurantoin, macrocrystal-monohydrate, (MACROBID) 100 MG capsule TAKE 1 CAPSULE BY MOUTH DAILY  FOR RECURRENT URINARY TRACT  INFECTION   [DISCONTINUED] sertraline (ZOLOFT) 100 MG tablet TAKE 2 TABLETS BY MOUTH DAILY   bisoprolol (ZEBETA) 10 MG tablet Take 1 tablet (10 mg total) by mouth at bedtime.   buPROPion (WELLBUTRIN XL) 300 MG 24 hr tablet Take 1 tablet (300 mg total) by mouth every morning.   levothyroxine (SYNTHROID) 75 MCG tablet Take 1 tablet (75 mcg total) by mouth daily.   sertraline (ZOLOFT) 100 MG tablet Take 2 tablets (200 mg total) by mouth daily.   No facility-administered encounter medications on file as of 12/24/2023.      Medical History: Past Medical History:  Diagnosis Date   Anemia    H/O YEARS AGO   Cancer (HCC)    SKIN CANCER ON FACE   GERD (gastroesophageal reflux disease)    Hypertension    Hypothyroidism    Thyroid disease    Phreesia 01/21/2021     Vital Signs: BP 138/82 Comment: 152/100  Pulse 73   Temp 97.9 F (36.6 C)   Resp 16   Ht 5\' 4"  (1.626 m)   Wt 185 lb 9.6 oz (84.2 kg)   SpO2 95%   BMI 31.86 kg/m    Review of Systems  Constitutional:  Positive for fatigue. Negative for chills and fever.  HENT:  Positive for congestion, postnasal drip, rhinorrhea, sinus pressure, sinus pain, sneezing and sore throat.   Respiratory:  Positive for cough. Negative for chest tightness, shortness of breath and wheezing.   Cardiovascular:  Negative for chest pain and palpitations.  Genitourinary:  Positive for dysuria, frequency, pelvic pain and  urgency.  Neurological:  Positive for headaches.    Physical Exam Vitals reviewed.  Constitutional:      Appearance: Normal appearance. She is obese. She is ill-appearing.  HENT:     Head: Normocephalic and atraumatic.     Right Ear: Tympanic membrane, ear canal and external ear normal.     Left Ear: Tympanic membrane, ear canal and external ear normal.     Nose: Mucosal edema, congestion and rhinorrhea present.     Right Turbinates: Swollen and pale.     Left Turbinates: Swollen and pale.     Right Sinus: Maxillary sinus tenderness present.     Left Sinus: Maxillary sinus tenderness present.     Mouth/Throat:     Mouth: Mucous membranes are moist.     Pharynx: Posterior oropharyngeal erythema present.  Eyes:     Pupils: Pupils are equal, round, and reactive to light.  Cardiovascular:     Rate and Rhythm: Normal rate and regular rhythm.  Pulmonary:     Effort: Pulmonary effort is normal. No respiratory distress.     Breath sounds: Normal breath sounds. No wheezing.  Neurological:     Mental Status: She is alert and oriented to person, place, and time.  Psychiatric:        Mood and Affect: Mood normal.        Behavior: Behavior normal.       Assessment/Plan: 1. Acute non-recurrent frontal sinusitis (Primary) Augmentin prescribed, take until gone  - amoxicillin-clavulanate (AUGMENTIN) 875-125 MG tablet; Take 1 tablet by mouth 2 (two) times daily for 10 days. Take with food.  Dispense: 20 tablet; Refill: 0  2. Urinary tract infection with hematuria, site unspecified Urine culture sent, augmentin prescribed, take until gone  - CULTURE, URINE COMPREHENSIVE - amoxicillin-clavulanate (AUGMENTIN) 875-125 MG tablet; Take 1 tablet by mouth 2 (two) times daily for 10 days. Take with food.  Dispense: 20 tablet; Refill: 0  3. Dysuria Urinalysis done, positive for trace blood, negative for leukocytes and nitrites  - POCT Urinalysis Dipstick  4. Encounter for medication  review Medication list reviewed, updated and refills ordered.  - levothyroxine (SYNTHROID) 75 MCG tablet; Take 1 tablet (75 mcg total) by mouth daily.  Dispense: 90 tablet; Refill: 3 - buPROPion (WELLBUTRIN XL) 300 MG 24 hr tablet; Take 1 tablet (300 mg total) by mouth every morning.  Dispense: 90 tablet; Refill: 3 - bisoprolol (ZEBETA) 10 MG tablet; Take 1 tablet (10 mg total) by mouth at bedtime.  Dispense: 90 tablet; Refill: 3 - sertraline (ZOLOFT) 100 MG tablet; Take 2 tablets (200 mg total) by mouth daily.  Dispense: 180 tablet; Refill: 3   General Counseling: Duyen verbalizes understanding of the findings of todays visit and agrees with plan of treatment. I have discussed any further diagnostic evaluation that may be needed or ordered today. We also reviewed her  medications today. she has been encouraged to call the office with any questions or concerns that should arise related to todays visit.    Counseling:    Orders Placed This Encounter  Procedures   CULTURE, URINE COMPREHENSIVE   POCT Urinalysis Dipstick    Meds ordered this encounter  Medications   levothyroxine (SYNTHROID) 75 MCG tablet    Sig: Take 1 tablet (75 mcg total) by mouth daily.    Dispense:  90 tablet    Refill:  3    Please send a replace/new response with 90-Day Supply if appropriate to maximize member benefit. Requesting 1 year supply.   buPROPion (WELLBUTRIN XL) 300 MG 24 hr tablet    Sig: Take 1 tablet (300 mg total) by mouth every morning.    Dispense:  90 tablet    Refill:  3    Please send a replace/new response with 90-Day Supply if appropriate to maximize member benefit. Requesting 1 year supply.   bisoprolol (ZEBETA) 10 MG tablet    Sig: Take 1 tablet (10 mg total) by mouth at bedtime.    Dispense:  90 tablet    Refill:  3    Please send a replace/new response with 90-Day Supply if appropriate to maximize member benefit. Requesting 1 year supply.   sertraline (ZOLOFT) 100 MG tablet    Sig:  Take 2 tablets (200 mg total) by mouth daily.    Dispense:  180 tablet    Refill:  3    Please send a replace/new response with 90-Day Supply if appropriate to maximize member benefit. Requesting 1 year supply.   amoxicillin-clavulanate (AUGMENTIN) 875-125 MG tablet    Sig: Take 1 tablet by mouth 2 (two) times daily for 10 days. Take with food.    Dispense:  20 tablet    Refill:  0    Fill new script today.    Return if symptoms worsen or fail to improve, for CPE, Samentha Perham PCP in february at patient's preference, have labs drawn prior to office visit. .  [place] Controlled Substance Database was reviewed by me for overdose risk score (ORS)  Time spent:30 Minutes Time spent with patient included reviewing progress notes, labs, imaging studies, and discussing plan for follow up.   This patient was seen by Sallyanne Kuster, FNP-C in collaboration with Dr. Beverely Risen as a part of collaborative care agreement.  Ixel Boehning R. Tedd Sias, MSN, FNP-C Internal Medicine

## 2023-12-27 LAB — CULTURE, URINE COMPREHENSIVE

## 2023-12-28 ENCOUNTER — Encounter: Payer: Self-pay | Admitting: Nurse Practitioner

## 2023-12-28 ENCOUNTER — Other Ambulatory Visit: Payer: Self-pay | Admitting: Nurse Practitioner

## 2023-12-28 DIAGNOSIS — E538 Deficiency of other specified B group vitamins: Secondary | ICD-10-CM

## 2023-12-28 DIAGNOSIS — R7301 Impaired fasting glucose: Secondary | ICD-10-CM

## 2023-12-28 DIAGNOSIS — E782 Mixed hyperlipidemia: Secondary | ICD-10-CM

## 2023-12-28 DIAGNOSIS — E039 Hypothyroidism, unspecified: Secondary | ICD-10-CM

## 2023-12-28 DIAGNOSIS — E559 Vitamin D deficiency, unspecified: Secondary | ICD-10-CM

## 2023-12-28 LAB — POCT URINALYSIS DIPSTICK
Bilirubin, UA: NEGATIVE
Clarity, UA: NEGATIVE
Glucose, UA: NEGATIVE
Ketones, UA: POSITIVE
Leukocytes, UA: NEGATIVE
Nitrite, UA: NEGATIVE
Protein, UA: NEGATIVE
Spec Grav, UA: 1.01 (ref 1.010–1.025)
Urobilinogen, UA: 0.2 U/dL
pH, UA: 5 (ref 5.0–8.0)

## 2023-12-28 NOTE — Progress Notes (Signed)
Try to call pt voice mail full

## 2023-12-28 NOTE — Progress Notes (Signed)
Routine lab ordered to be done by her next office visit.

## 2023-12-29 NOTE — Progress Notes (Signed)
Pt advised labs are ordered

## 2024-01-20 LAB — CBC WITH DIFFERENTIAL/PLATELET
Basophils Absolute: 0 10*3/uL (ref 0.0–0.2)
Basos: 1 %
EOS (ABSOLUTE): 0.1 10*3/uL (ref 0.0–0.4)
Eos: 2 %
Hematocrit: 42.3 % (ref 34.0–46.6)
Hemoglobin: 14.6 g/dL (ref 11.1–15.9)
Immature Grans (Abs): 0 10*3/uL (ref 0.0–0.1)
Immature Granulocytes: 0 %
Lymphocytes Absolute: 1.3 10*3/uL (ref 0.7–3.1)
Lymphs: 20 %
MCH: 31.6 pg (ref 26.6–33.0)
MCHC: 34.5 g/dL (ref 31.5–35.7)
MCV: 92 fL (ref 79–97)
Monocytes Absolute: 0.6 10*3/uL (ref 0.1–0.9)
Monocytes: 10 %
Neutrophils Absolute: 4.3 10*3/uL (ref 1.4–7.0)
Neutrophils: 67 %
Platelets: 142 10*3/uL — ABNORMAL LOW (ref 150–450)
RBC: 4.62 x10E6/uL (ref 3.77–5.28)
RDW: 12 % (ref 11.7–15.4)
WBC: 6.4 10*3/uL (ref 3.4–10.8)

## 2024-01-20 LAB — TSH+T4F+T3FREE+THYABS+TPO+VD25
Free T4: 1.31 ng/dL (ref 0.82–1.77)
T3, Free: 2.6 pg/mL (ref 2.0–4.4)
TSH: 3.67 u[IU]/mL (ref 0.450–4.500)
Thyroglobulin Antibody: <1 [IU]/mL (ref 0.0–0.9)
Thyroperoxidase Ab SerPl-aCnc: 15 [IU]/mL (ref 0–34)
Vit D, 25-Hydroxy: 64.8 ng/mL (ref 30.0–100.0)

## 2024-01-20 LAB — CMP14+EGFR
ALT: 18 [IU]/L (ref 0–32)
AST: 16 [IU]/L (ref 0–40)
Albumin: 4.5 g/dL (ref 3.9–4.9)
Alkaline Phosphatase: 72 [IU]/L (ref 44–121)
BUN/Creatinine Ratio: 16 (ref 12–28)
BUN: 12 mg/dL (ref 8–27)
Bilirubin Total: 0.7 mg/dL (ref 0.0–1.2)
CO2: 27 mmol/L (ref 20–29)
Calcium: 9.6 mg/dL (ref 8.7–10.3)
Chloride: 97 mmol/L (ref 96–106)
Creatinine, Ser: 0.73 mg/dL (ref 0.57–1.00)
Globulin, Total: 2.4 g/dL (ref 1.5–4.5)
Glucose: 96 mg/dL (ref 70–99)
Potassium: 4 mmol/L (ref 3.5–5.2)
Sodium: 138 mmol/L (ref 134–144)
Total Protein: 6.9 g/dL (ref 6.0–8.5)
eGFR: 92 mL/min/{1.73_m2} (ref >59)

## 2024-01-20 LAB — LIPID PANEL
Chol/HDL Ratio: 4.1 {ratio} (ref 0.0–4.4)
Cholesterol, Total: 229 mg/dL — ABNORMAL HIGH (ref 100–199)
HDL: 56 mg/dL (ref >39)
LDL Chol Calc (NIH): 156 mg/dL — ABNORMAL HIGH (ref 0–99)
Triglycerides: 98 mg/dL (ref 0–149)
VLDL Cholesterol Cal: 17 mg/dL (ref 5–40)

## 2024-01-20 LAB — B12 AND FOLATE PANEL
Folate: 4.8 ng/mL (ref >3.0)
Vitamin B-12: 1108 pg/mL (ref 232–1245)

## 2024-01-20 LAB — HGB A1C W/O EAG: Hgb A1c MFr Bld: 5 % (ref 4.8–5.6)

## 2024-01-21 ENCOUNTER — Telehealth: Payer: Self-pay | Admitting: Nurse Practitioner

## 2024-01-21 NOTE — Telephone Encounter (Signed)
MB full, sent mychart message to confirm 01/28/24 appointment-Toni

## 2024-01-22 ENCOUNTER — Other Ambulatory Visit: Payer: Self-pay | Admitting: Nurse Practitioner

## 2024-01-22 DIAGNOSIS — F5101 Primary insomnia: Secondary | ICD-10-CM

## 2024-01-25 NOTE — Telephone Encounter (Signed)
 Hey Alyssa. You are listed as patient's primary care provider. She will have to have this filled by you.

## 2024-01-28 ENCOUNTER — Encounter: Payer: Self-pay | Admitting: Nurse Practitioner

## 2024-01-28 ENCOUNTER — Ambulatory Visit: Payer: 59 | Admitting: Nurse Practitioner

## 2024-01-28 VITALS — BP 135/82 | HR 69 | Temp 97.8°F | Resp 16 | Ht 64.0 in | Wt 187.0 lb

## 2024-01-28 DIAGNOSIS — Z1231 Encounter for screening mammogram for malignant neoplasm of breast: Secondary | ICD-10-CM

## 2024-01-28 DIAGNOSIS — D696 Thrombocytopenia, unspecified: Secondary | ICD-10-CM

## 2024-01-28 DIAGNOSIS — Z0001 Encounter for general adult medical examination with abnormal findings: Secondary | ICD-10-CM | POA: Diagnosis not present

## 2024-01-28 DIAGNOSIS — J0110 Acute frontal sinusitis, unspecified: Secondary | ICD-10-CM

## 2024-01-28 DIAGNOSIS — Z23 Encounter for immunization: Secondary | ICD-10-CM | POA: Diagnosis not present

## 2024-01-28 DIAGNOSIS — E782 Mixed hyperlipidemia: Secondary | ICD-10-CM | POA: Diagnosis not present

## 2024-01-28 MED ORDER — ZOSTER VAC RECOMB ADJUVANTED 50 MCG/0.5ML IM SUSR: 0.5000 mL | Freq: Once | INTRAMUSCULAR | 1 refills | Status: DC | PRN

## 2024-01-28 MED ORDER — LEVOFLOXACIN 500 MG PO TABS: 500.0000 mg | ORAL_TABLET | Freq: Every day | ORAL | 0 refills | Status: DC

## 2024-01-28 NOTE — Progress Notes (Signed)
 [facility] [address] [place], [place] [address]  Internal MEDICINE  Office Visit Note  Patient Name: Grace Christensen  [id number]  [id number]  Date of Service: 01/28/2024  Chief Complaint  Patient presents with   Gastroesophageal Reflux   Hypertension   Annual Exam   Cough   Quality Metric Gaps    mammogram    HPI Grace Christensen presents for an annual well visit and physical exam.  Well-appearing 63 y.o. female with hypertension, GERD, hypothyroidism, osteoarthritis of left knee, high cholesterol, insomnia, GAD and depression.  Routine CRC screening: due in 2026 Routine mammogram: due now  DEXA scan: due in 2 years.  Pap smear: due in 2028 Labs: lab results reviewed with patient. All levels were normal except slightly low platelet level and elevated LDL.  New or worsening pain: none  Sinus infection did not fully resolve and symptoms are worsening again.    Current Medication: Outpatient Encounter Medications as of 01/28/2024  Medication Sig   ALPRAZolam (XANAX) 0.25 MG tablet Take 1 tablet (0.25 mg total) by mouth 2 (two) times daily as needed for anxiety.   B Complex Vitamins (B COMPLEX PO) Place 1 mL under the tongue daily.   Biotin 1000 MCG tablet Take 1,000 mcg by mouth daily.   bisoprolol (ZEBETA) 10 MG tablet Take 1 tablet (10 mg total) by mouth at bedtime.   buPROPion (WELLBUTRIN XL) 300 MG 24 hr tablet Take 1 tablet (300 mg total) by mouth every morning.   Calcium Carb-Cholecalciferol 600-800 MG-UNIT TABS Take 1 tablet by mouth daily.   Cholecalciferol (VITAMIN D) 2000 units tablet Take 2,000 Units by mouth daily.   fexofenadine (ALLEGRA) 180 MG tablet Take 180 mg by mouth every morning. Allergy Relief   levofloxacin (LEVAQUIN) 500 MG tablet Take 1 tablet (500 mg total) by mouth daily. Take with food   levothyroxine (SYNTHROID) 75 MCG tablet Take 1 tablet (75 mcg total) by mouth daily.   Omega-3 Fatty Acids (OMEGA-3 FISH OIL) 1200 MG CAPS Take 1-2 capsules  by mouth See admin instructions. 1 capsule in the morning, and 2 capsules in the evening   Potassium 99 MG TABS Take 1 tablet by mouth daily.   sertraline (ZOLOFT) 100 MG tablet Take 2 tablets (200 mg total) by mouth daily.   traZODone (DESYREL) 100 MG tablet TAKE 3 TABLETS BY MOUTH AT  BEDTIME AS NEEDED FOR INSOMNNIA   vitamin B-12 (CYANOCOBALAMIN) 1000 MCG tablet Take 1,000 mcg by mouth daily.   Zoster Vaccine Adjuvanted [facility]) injection Inject 0.5 mLs into the muscle once as needed for up to 1 dose (shingles vaccination).   No facility-administered encounter medications on file as of 01/28/2024.    Surgical History: Past Surgical History:  Procedure Laterality Date   COLONOSCOPY     FUNCTIONAL ENDOSCOPIC SINUS SURGERY     KNEE ARTHROSCOPY Right 03/04/2017   Procedure: ARTHROSCOPY KNEE, REMOVAL OF LOOSE BODY. CHONDROPLASTY MEDIAL COMPARTMENT;  Surgeon: Donato Heinz, MD;  Location: [facility] ORS;  Service: Orthopedics;  Laterality: Right;   NOVASURE ABLATION     TUBAL LIGATION      Medical History: Past Medical History:  Diagnosis Date   Anemia    H/O YEARS AGO   Cancer (HCC)    SKIN CANCER ON FACE   GERD (gastroesophageal reflux disease)    Hypertension    Hypothyroidism    Thyroid disease    Phreesia 01/21/2021    Family History: Family History  Problem Relation Age of Onset  Cancer Mother    Hypertension Mother    Prostate cancer Father     Social History   Socioeconomic History   Marital status: Married    Spouse name: Not on file   Number of children: Not on file   Years of education: Not on file   Highest education level: Not on file  Occupational History   Occupation: IT trainer    Employer: [employer]  Tobacco Use   Smoking status: Former    Current packs/day: 0.00    Average packs/day: 1 pack/day for 15.0 years (15.0 ttl pk-yrs)    Types: Cigarettes    Start date: 07/27/1994    Quit date: 07/27/2009    Years since quitting: 14.5   Smokeless tobacco:  Never  Substance and Sexual Activity   Alcohol use: Yes    Comment: OCC WEEKEND    Drug use: No   Sexual activity: Yes  Other Topics Concern   Not on file  Social History Narrative   Not on file   Social Drivers of Health   Financial Resource Strain: Not on file  Food Insecurity: Not on file  Transportation Needs: Not on file  Physical Activity: Not on file  Stress: Not on file  Social Connections: Not on file  Intimate Partner Violence: Not on file      Review of Systems  Constitutional:  Positive for fatigue. Negative for chills, diaphoresis and fever.  HENT:  Positive for congestion, postnasal drip, rhinorrhea, sinus pressure, sinus pain, sneezing and sore throat. Negative for ear pain.   Eyes:  Negative for photophobia, discharge, redness, itching and visual disturbance.  Respiratory:  Positive for cough. Negative for chest tightness, shortness of breath and wheezing.   Cardiovascular:  Negative for chest pain, palpitations and leg swelling.  Gastrointestinal:  Negative for abdominal pain, constipation, diarrhea, nausea and vomiting.  Genitourinary:  Negative for dysuria, flank pain, frequency, pelvic pain and urgency.  Musculoskeletal:  Negative for arthralgias, back pain, gait problem and neck pain.  Skin:  Negative for color change.  Allergic/Immunologic: Negative for environmental allergies and food allergies.  Neurological:  Positive for headaches. Negative for dizziness.  Hematological:  Does not bruise/bleed easily.  Psychiatric/Behavioral:  Negative for agitation, behavioral problems (depression) and hallucinations.     Vital Signs: BP 135/82 Comment: 142/84  Pulse 69   Temp 97.8 F (36.6 C)   Resp 16   Ht 5\' 4"  (1.626 m)   Wt 187 lb (84.8 kg)   SpO2 97%   BMI 32.10 kg/m    Physical Exam Vitals reviewed.  Constitutional:      General: She is not in acute distress.    Appearance: Normal appearance. She is well-developed. She is obese. She is  ill-appearing. She is not diaphoretic.  HENT:     Head: Normocephalic and atraumatic.     Right Ear: Tympanic membrane, ear canal and external ear normal.     Left Ear: Tympanic membrane, ear canal and external ear normal.     Nose: Mucosal edema, congestion and rhinorrhea present.     Right Turbinates: Swollen and pale.     Left Turbinates: Swollen and pale.     Right Sinus: Maxillary sinus tenderness present.     Left Sinus: Maxillary sinus tenderness present.     Mouth/Throat:     Mouth: Mucous membranes are moist.     Pharynx: Posterior oropharyngeal erythema present. No oropharyngeal exudate.  Eyes:     Pupils: Pupils are equal, round, and  reactive to light.  Neck:     Thyroid: No thyromegaly.     Vascular: No JVD.     Trachea: No tracheal deviation.  Cardiovascular:     Rate and Rhythm: Normal rate and regular rhythm.     Heart sounds: Normal heart sounds. No murmur heard.    No friction rub. No gallop.  Pulmonary:     Effort: Pulmonary effort is normal. No respiratory distress.     Breath sounds: Normal breath sounds. No wheezing or rales.  Chest:     Chest wall: No tenderness.  Breasts:    Right: Normal. No mass.     Left: Normal. No mass.  Abdominal:     General: Bowel sounds are normal.     Palpations: Abdomen is soft.  Musculoskeletal:        General: Normal range of motion.     Cervical back: Normal range of motion and neck supple.  Lymphadenopathy:     Cervical: No cervical adenopathy.  Skin:    General: Skin is warm and dry.  Neurological:     Mental Status: She is alert and oriented to person, place, and time.     Cranial Nerves: No cranial nerve deficit.  Psychiatric:        Mood and Affect: Mood normal.        Behavior: Behavior normal.        Thought Content: Thought content normal.        Judgment: Judgment normal.        Assessment/Plan: 1. Encounter for routine adult health examination with abnormal findings (Primary) Age-appropriate  preventive screenings and vaccinations discussed, annual physical exam completed. Routine labs for health maintenance results reviewed with patient today. PHM updated.    2. Acute non-recurrent frontal sinusitis 7 day course of levofloxacin prescribed, take until gone  - levofloxacin (LEVAQUIN) 500 MG tablet; Take 1 tablet (500 mg total) by mouth daily. Take with food  Dispense: 7 tablet; Refill: 0  3. Mixed hyperlipidemia Limit red meat intake, increase lean protein, increase physical activity.   4. Thrombocytopenia (HCC) Repeat CBC ordered  - CBC with Differential/Platelet  5. Need for vaccination - Zoster Vaccine Adjuvanted [facility]) injection; Inject 0.5 mLs into the muscle once as needed for up to 1 dose (shingles vaccination).  Dispense: 0.5 mL; Refill: 1  6. Encounter for screening mammogram for malignant neoplasm of breast Routine mammogram ordered  - MM 3D SCREENING MAMMOGRAM BILATERAL BREAST; Future      General Counseling: Ila verbalizes understanding of the findings of todays visit and agrees with plan of treatment. I have discussed any further diagnostic evaluation that may be needed or ordered today. We also reviewed her medications today. she has been encouraged to call the office with any questions or concerns that should arise related to todays visit.    Orders Placed This Encounter  Procedures   MM 3D SCREENING MAMMOGRAM BILATERAL BREAST   CBC with Differential/Platelet    Meds ordered this encounter  Medications   Zoster Vaccine Adjuvanted [facility]) injection    Sig: Inject 0.5 mLs into the muscle once as needed for up to 1 dose (shingles vaccination).    Dispense:  0.5 mL    Refill:  1    Patient in need of 2 dose series. Had first dose in 2023 but did not get second dose so need both.   levofloxacin (LEVAQUIN) 500 MG tablet    Sig: Take 1 tablet (500 mg total) by mouth daily.  Take with food    Dispense:  7 tablet    Refill:  0    Fill new script  today    Return in about 6 months (around 07/27/2024) for F/U, Grace Christensen PCP.   Total time spent:30 Minutes Time spent includes review of chart, medications, test results, and follow up plan with the patient.   [place] Controlled Substance Database was reviewed by me.  This patient was seen by Sallyanne Kuster, FNP-C in collaboration with Dr. Beverely Risen as a part of collaborative care agreement.  Kyshawn Teal R. Tedd Sias, MSN, FNP-C Internal medicine

## 2024-02-05 ENCOUNTER — Encounter: Payer: Self-pay | Admitting: Nurse Practitioner

## 2024-02-06 ENCOUNTER — Encounter: Payer: Self-pay | Admitting: Nurse Practitioner

## 2024-02-13 ENCOUNTER — Other Ambulatory Visit: Payer: Self-pay | Admitting: Nurse Practitioner

## 2024-02-13 DIAGNOSIS — F5101 Primary insomnia: Secondary | ICD-10-CM

## 2024-02-28 ENCOUNTER — Other Ambulatory Visit: Payer: Self-pay | Admitting: Nurse Practitioner

## 2024-02-28 DIAGNOSIS — Z79899 Other long term (current) drug therapy: Secondary | ICD-10-CM

## 2024-08-22 ENCOUNTER — Telehealth (INDEPENDENT_AMBULATORY_CARE_PROVIDER_SITE_OTHER): Admitting: Physician Assistant

## 2024-08-22 ENCOUNTER — Encounter: Payer: Self-pay | Admitting: Physician Assistant

## 2024-08-22 VITALS — Ht 64.0 in | Wt 185.0 lb

## 2024-08-22 DIAGNOSIS — J069 Acute upper respiratory infection, unspecified: Secondary | ICD-10-CM | POA: Diagnosis not present

## 2024-08-22 NOTE — Progress Notes (Signed)
 [facility] [address] [place], [place] [address]  Internal MEDICINE  Telephone Visit  Patient Name: Grace Christensen  [id number]  [id number]  Date of Service: 08/22/2024  I connected with the patient at 8:30 by telephone and verified the patients identity using two identifiers.   I discussed the limitations, risks, security and privacy concerns of performing an evaluation and management service by telephone and the availability of in person appointments. I also discussed with the patient that there may be a patient responsible charge related to the service.  The patient expressed understanding and agrees to proceed.    Chief Complaint  Patient presents with   Telephone Screen   Telephone Assessment   Sinusitis   Cough    HPI Pt is here for a sick visit. -Neighbors had a cough and drainage and sinus congestion -now she has devloped the same symptoms -she has not done any covid or flu testing, has been going on a few days -cough described as deep, has spit up some yellow drainage and blowing nose, but otherwise feels ok -no fever, chills, vomiting, diarrhea -has been using vicks severe sinus, cough syrup, and allegra. Has nasal spray but hasn't used it and will  Current Medication: Outpatient Encounter Medications as of 08/22/2024  Medication Sig   ALPRAZolam  (XANAX ) 0.25 MG tablet Take 1 tablet (0.25 mg total) by mouth 2 (two) times daily as needed for anxiety.   B Complex Vitamins (B COMPLEX PO) Place 1 mL under the tongue daily.   Biotin 1000 MCG tablet Take 1,000 mcg by mouth daily.   bisoprolol  (ZEBETA ) 10 MG tablet Take 1 tablet (10 mg total) by mouth at bedtime.   buPROPion  (WELLBUTRIN  XL) 300 MG 24 hr tablet Take 1 tablet (300 mg total) by mouth every morning.   Calcium Carb-Cholecalciferol 600-800 MG-UNIT TABS Take 1 tablet by mouth daily.   Cholecalciferol (VITAMIN D ) 2000 units tablet Take 2,000 Units by mouth daily.   fexofenadine (ALLEGRA) 180 MG tablet  Take 180 mg by mouth every morning. Allergy Relief   levofloxacin  (LEVAQUIN ) 500 MG tablet Take 1 tablet (500 mg total) by mouth daily. Take with food   levothyroxine  (SYNTHROID ) 75 MCG tablet TAKE 1 TABLET BY MOUTH DAILY   Omega-3 Fatty Acids (OMEGA-3 FISH OIL) 1200 MG CAPS Take 1-2 capsules by mouth See admin instructions. 1 capsule in the morning, and 2 capsules in the evening   Potassium 99 MG TABS Take 1 tablet by mouth daily.   sertraline  (ZOLOFT ) 100 MG tablet Take 2 tablets (200 mg total) by mouth daily.   traZODone  (DESYREL ) 100 MG tablet TAKE 3 TABLETS BY MOUTH AT  BEDTIME AS NEEDED FOR INSOMNNIA   vitamin B-12 (CYANOCOBALAMIN) 1000 MCG tablet Take 1,000 mcg by mouth daily.   Zoster Vaccine Adjuvanted (SHINGRIX ) injection Inject 0.5 mLs into the muscle once as needed for up to 1 dose (shingles vaccination).   No facility-administered encounter medications on file as of 08/22/2024.    Surgical History: Past Surgical History:  Procedure Laterality Date   COLONOSCOPY     FUNCTIONAL ENDOSCOPIC SINUS SURGERY     KNEE ARTHROSCOPY Right 03/04/2017   Procedure: ARTHROSCOPY KNEE, REMOVAL OF LOOSE BODY. CHONDROPLASTY MEDIAL COMPARTMENT;  Surgeon: Lynwood SHAUNNA Hue, MD;  Location: [facility] ORS;  Service: Orthopedics;  Laterality: Right;   NOVASURE ABLATION     TUBAL LIGATION      Medical History: Past Medical History:  Diagnosis Date   Anemia    H/O YEARS  AGO   Cancer (HCC)    SKIN CANCER ON FACE   GERD (gastroesophageal reflux disease)    Hypertension    Hypothyroidism    Thyroid  disease    Phreesia 01/21/2021    Family History: Family History  Problem Relation Age of Onset   Cancer Mother    Hypertension Mother    Prostate cancer Father     Social History   Socioeconomic History   Marital status: Married    Spouse name: Not on file   Number of children: Not on file   Years of education: Not on file   Highest education level: Not on file  Occupational History    Occupation: IT trainer    Employer: [employer]  Tobacco Use   Smoking status: Former    Current packs/day: 0.00    Average packs/day: 1 pack/day for 15.0 years (15.0 ttl pk-yrs)    Types: Cigarettes    Start date: 07/27/1994    Quit date: 07/27/2009    Years since quitting: 15.0   Smokeless tobacco: Never  Substance and Sexual Activity   Alcohol use: Yes    Comment: OCC WEEKEND    Drug use: No   Sexual activity: Yes  Other Topics Concern   Not on file  Social History Narrative   Not on file   Social Drivers of Health   Financial Resource Strain: Not on file  Food Insecurity: Not on file  Transportation Needs: Not on file  Physical Activity: Not on file  Stress: Not on file  Social Connections: Not on file  Intimate Partner Violence: Not on file      Review of Systems  Constitutional:  Negative for chills, fatigue and fever.  HENT:  Positive for congestion and postnasal drip. Negative for mouth sores.   Respiratory:  Positive for cough. Negative for shortness of breath and wheezing.   Cardiovascular:  Negative for chest pain.  Gastrointestinal:  Negative for diarrhea, nausea and vomiting.  Genitourinary:  Negative for flank pain.  Psychiatric/Behavioral: Negative.      Vital Signs: Ht 5' 4 (1.626 m)   Wt 185 lb (83.9 kg)   BMI 31.76 kg/m    Observation/Objective:  Pt is able to carry out conversation   Assessment/Plan: 1. Viral upper respiratory tract infection (Primary) Given recent onset of symptoms following sick contacts, likely viral and will continue cough syrup and OTC sinus medication. May add flonase to help. Advised to call if worsening or not improving and ABX may be sent at that time.   General Counseling: Grace Christensen understanding of the findings of today's phone visit and agrees with plan of treatment. I have discussed any further diagnostic evaluation that may be needed or ordered today. We also reviewed her medications today. she has been  encouraged to call the office with any questions or concerns that should arise related to todays visit.    No orders of the defined types were placed in this encounter.   No orders of the defined types were placed in this encounter.   Time spent:25 Minutes    Dr Sigrid CHRISTELLA [facility]

## 2024-08-22 NOTE — Progress Notes (Deleted)
 [facility] [address] [place], [place] [address]  Internal MEDICINE  Office Visit Note  Patient Name: Grace Christensen  [id number]  [id number]  Date of Service: 08/22/2024  Chief Complaint  Patient presents with   Telephone Screen   Telephone Assessment   Sinusitis   Cough     HPI Pt is here for a sick visit. -Neighbors had a cough and drainage and sinus congestion -now she has devloped the same symptoms -she has not done any covid or flu testing, has been going on a few days -cough described as deep, has spit up some yellow drainage and blowing nose, but otherwise feels ok -no fever, chills, vomiting, diarrhea -has been using vicks severe sinus, cough syrup, and allegra. Has nasal spray but hasn't used it and will  Current Medication:  Outpatient Encounter Medications as of 08/22/2024  Medication Sig   ALPRAZolam  (XANAX ) 0.25 MG tablet Take 1 tablet (0.25 mg total) by mouth 2 (two) times daily as needed for anxiety.   B Complex Vitamins (B COMPLEX PO) Place 1 mL under the tongue daily.   Biotin 1000 MCG tablet Take 1,000 mcg by mouth daily.   bisoprolol  (ZEBETA ) 10 MG tablet Take 1 tablet (10 mg total) by mouth at bedtime.   buPROPion  (WELLBUTRIN  XL) 300 MG 24 hr tablet Take 1 tablet (300 mg total) by mouth every morning.   Calcium Carb-Cholecalciferol 600-800 MG-UNIT TABS Take 1 tablet by mouth daily.   Cholecalciferol (VITAMIN D ) 2000 units tablet Take 2,000 Units by mouth daily.   fexofenadine (ALLEGRA) 180 MG tablet Take 180 mg by mouth every morning. Allergy Relief   levofloxacin  (LEVAQUIN ) 500 MG tablet Take 1 tablet (500 mg total) by mouth daily. Take with food   levothyroxine  (SYNTHROID ) 75 MCG tablet TAKE 1 TABLET BY MOUTH DAILY   Omega-3 Fatty Acids (OMEGA-3 FISH OIL) 1200 MG CAPS Take 1-2 capsules by mouth See admin instructions. 1 capsule in the morning, and 2 capsules in the evening   Potassium 99 MG TABS Take 1 tablet by mouth daily.    sertraline  (ZOLOFT ) 100 MG tablet Take 2 tablets (200 mg total) by mouth daily.   traZODone  (DESYREL ) 100 MG tablet TAKE 3 TABLETS BY MOUTH AT  BEDTIME AS NEEDED FOR INSOMNNIA   vitamin B-12 (CYANOCOBALAMIN) 1000 MCG tablet Take 1,000 mcg by mouth daily.   Zoster Vaccine Adjuvanted (SHINGRIX ) injection Inject 0.5 mLs into the muscle once as needed for up to 1 dose (shingles vaccination).   No facility-administered encounter medications on file as of 08/22/2024.      Medical History: Past Medical History:  Diagnosis Date   Anemia    H/O YEARS AGO   Cancer (HCC)    SKIN CANCER ON FACE   GERD (gastroesophageal reflux disease)    Hypertension    Hypothyroidism    Thyroid  disease    Phreesia 01/21/2021     Vital Signs: Ht 5' 4 (1.626 m)   Wt 185 lb (83.9 kg)   BMI 31.76 kg/m    Review of Systems  Physical Exam    Assessment/Plan:   General Counseling: Jovonne verbalizes understanding of the findings of todays visit and agrees with plan of treatment. I have discussed any further diagnostic evaluation that may be needed or ordered today. We also reviewed her medications today. she has been encouraged to call the office with any questions or concerns that should arise related to todays visit.    Counseling:    No orders  of the defined types were placed in this encounter.   No orders of the defined types were placed in this encounter.   Time spent:*** Minutes

## 2024-11-02 ENCOUNTER — Other Ambulatory Visit: Payer: Self-pay | Admitting: Nurse Practitioner

## 2024-11-02 DIAGNOSIS — Z79899 Other long term (current) drug therapy: Secondary | ICD-10-CM

## 2024-11-10 ENCOUNTER — Encounter: Payer: Self-pay | Admitting: Physician Assistant

## 2024-11-10 ENCOUNTER — Ambulatory Visit: Admitting: Physician Assistant

## 2024-11-10 VITALS — BP 138/85 | HR 71 | Temp 98.0°F | Resp 16 | Ht 64.0 in | Wt 170.0 lb

## 2024-11-10 DIAGNOSIS — G4710 Hypersomnia, unspecified: Secondary | ICD-10-CM | POA: Diagnosis not present

## 2024-11-10 DIAGNOSIS — J0110 Acute frontal sinusitis, unspecified: Secondary | ICD-10-CM | POA: Diagnosis not present

## 2024-11-10 MED ORDER — AMOXICILLIN-POT CLAVULANATE 875-125 MG PO TABS: 1.0000 | ORAL_TABLET | Freq: Two times a day (BID) | ORAL | 0 refills | Status: DC

## 2024-11-10 NOTE — Progress Notes (Signed)
 [facility] [address] [place], [place] [address]  Internal MEDICINE  Office Visit Note  Patient Name: Grace Christensen  [id number]  [id number]  Date of Service: 11/10/2024  Chief Complaint  Patient presents with   Acute Visit   Sinusitis   Medication Refill     HPI Pt is here for a sick visit. -using mucinex, cough syrup, doesn't like nasal sprays -cough for a long time, no wheezing or SOB -Some chills, but no fever -No N/V/D -sinus pressure and runny nose. Getting some nose bleeds now -states she is tired all the time -snoring at night, can sleep 12 hours and still feel tired. Brain fogginess and trouble concentrating also present  Current Medication:  Outpatient Encounter Medications as of 11/10/2024  Medication Sig   ALPRAZolam  (XANAX ) 0.25 MG tablet Take 1 tablet (0.25 mg total) by mouth 2 (two) times daily as needed for anxiety.   B Complex Vitamins (B COMPLEX PO) Place 1 mL under the tongue daily.   Biotin 1000 MCG tablet Take 1,000 mcg by mouth daily.   bisoprolol  (ZEBETA ) 10 MG tablet Take 1 tablet (10 mg total) by mouth at bedtime.   buPROPion  (WELLBUTRIN  XL) 300 MG 24 hr tablet Take 1 tablet (300 mg total) by mouth every morning.   Calcium Carb-Cholecalciferol 600-800 MG-UNIT TABS Take 1 tablet by mouth daily.   Cholecalciferol (VITAMIN D ) 2000 units tablet Take 2,000 Units by mouth daily.   fexofenadine (ALLEGRA) 180 MG tablet Take 180 mg by mouth every morning. Allergy Relief   levofloxacin  (LEVAQUIN ) 500 MG tablet Take 1 tablet (500 mg total) by mouth daily. Take with food   levothyroxine  (SYNTHROID ) 75 MCG tablet TAKE 1 TABLET BY MOUTH DAILY   Omega-3 Fatty Acids (OMEGA-3 FISH OIL) 1200 MG CAPS Take 1-2 capsules by mouth See admin instructions. 1 capsule in the morning, and 2 capsules in the evening   Potassium 99 MG TABS Take 1 tablet by mouth daily.   sertraline  (ZOLOFT ) 100 MG tablet Take 2 tablets (200 mg total) by mouth daily.    traZODone  (DESYREL ) 100 MG tablet TAKE 3 TABLETS BY MOUTH AT  BEDTIME AS NEEDED FOR INSOMNNIA   vitamin B-12 (CYANOCOBALAMIN) 1000 MCG tablet Take 1,000 mcg by mouth daily.   Zoster Vaccine Adjuvanted (SHINGRIX ) injection Inject 0.5 mLs into the muscle once as needed for up to 1 dose (shingles vaccination).   No facility-administered encounter medications on file as of 11/10/2024.      Medical History: Past Medical History:  Diagnosis Date   Anemia    H/O YEARS AGO   Cancer (HCC)    SKIN CANCER ON FACE   GERD (gastroesophageal reflux disease)    Hypertension    Hypothyroidism    Thyroid  disease    Phreesia 01/21/2021     Vital Signs: BP 138/85   Pulse 71   Temp 98 F (36.7 C)   Resp 16   Ht 5' 4 (1.626 m)   Wt 170 lb (77.1 kg)   SpO2 95%   BMI 29.18 kg/m    Review of Systems  Constitutional:  Positive for fatigue. Negative for chills and fever.  HENT:  Positive for congestion, nosebleeds, postnasal drip, rhinorrhea and sinus pressure. Negative for mouth sores.   Respiratory:  Positive for cough. Negative for shortness of breath and wheezing.   Cardiovascular:  Negative for chest pain.  Gastrointestinal:  Negative for diarrhea, nausea and vomiting.  Genitourinary:  Negative for flank pain.  Psychiatric/Behavioral:  Positive for sleep disturbance.     Physical Exam Vitals and nursing note reviewed.  Constitutional:      General: She is not in acute distress.    Appearance: She is well-developed. She is not diaphoretic.  HENT:     Head: Normocephalic and atraumatic.     Mouth/Throat:     Pharynx: Posterior oropharyngeal erythema present.  Eyes:     Extraocular Movements: Extraocular movements intact.  Neck:     Thyroid : No thyromegaly.     Vascular: No JVD.     Trachea: No tracheal deviation.  Cardiovascular:     Rate and Rhythm: Normal rate and regular rhythm.     Heart sounds: Normal heart sounds. No murmur heard.    No friction rub. No gallop.   Pulmonary:     Effort: Pulmonary effort is normal. No respiratory distress.     Breath sounds: No wheezing.  Skin:    General: Skin is warm and dry.  Neurological:     Mental Status: She is alert and oriented to person, place, and time.  Psychiatric:        Behavior: Behavior normal.        Thought Content: Thought content normal.        Judgment: Judgment normal.       Assessment/Plan: 1. Acute non-recurrent frontal sinusitis (Primary) Will start augmentin  BID with food, may continue mucinex - amoxicillin -clavulanate (AUGMENTIN ) 875-125 MG tablet; Take 1 tablet by mouth 2 (two) times daily. Take with food.  Dispense: 20 tablet; Refill: 0  2. Hypersomnia Due to excessive sleepiness despite long sleep time, snoring, brain fog, and memory concerns, will order HST for further evaluation - Home sleep test   General Counseling: abbigail anstey understanding of the findings of todays visit and agrees with plan of treatment. I have discussed any further diagnostic evaluation that may be needed or ordered today. We also reviewed her medications today. she has been encouraged to call the office with any questions or concerns that should arise related to todays visit.    Counseling:    No orders of the defined types were placed in this encounter.   No orders of the defined types were placed in this encounter.   Time spent:30 Minutes

## 2024-11-11 ENCOUNTER — Telehealth: Payer: Self-pay | Admitting: Nurse Practitioner

## 2024-11-11 NOTE — Telephone Encounter (Signed)
 HST order emailed to [facility]

## 2024-11-21 ENCOUNTER — Encounter: Payer: Self-pay | Admitting: Physician Assistant

## 2024-11-22 NOTE — Telephone Encounter (Signed)
 Pt notified that we sent optum for 1 month and also gave toni for appt in 1 month

## 2024-12-07 ENCOUNTER — Telehealth: Payer: Self-pay | Admitting: Nurse Practitioner

## 2024-12-07 ENCOUNTER — Ambulatory Visit: Admitting: Nurse Practitioner

## 2024-12-07 ENCOUNTER — Encounter: Payer: Self-pay | Admitting: Nurse Practitioner

## 2024-12-07 VITALS — BP 110/61 | HR 68 | Temp 98.0°F | Resp 16 | Ht 64.0 in | Wt 187.4 lb

## 2024-12-07 DIAGNOSIS — Z1231 Encounter for screening mammogram for malignant neoplasm of breast: Secondary | ICD-10-CM

## 2024-12-07 DIAGNOSIS — F331 Major depressive disorder, recurrent, moderate: Secondary | ICD-10-CM | POA: Diagnosis not present

## 2024-12-07 DIAGNOSIS — F5101 Primary insomnia: Secondary | ICD-10-CM | POA: Diagnosis not present

## 2024-12-07 DIAGNOSIS — I10 Essential (primary) hypertension: Secondary | ICD-10-CM

## 2024-12-07 DIAGNOSIS — E039 Hypothyroidism, unspecified: Secondary | ICD-10-CM | POA: Diagnosis not present

## 2024-12-07 DIAGNOSIS — J320 Chronic maxillary sinusitis: Secondary | ICD-10-CM | POA: Insufficient documentation

## 2024-12-07 MED ORDER — TRAZODONE HCL 100 MG PO TABS: ORAL_TABLET | ORAL | 1 refills | Status: AC

## 2024-12-07 MED ORDER — PREDNISONE 10 MG (21) PO TBPK: ORAL_TABLET | ORAL | 0 refills | Status: AC

## 2024-12-07 MED ORDER — BUPROPION HCL ER (XL) 300 MG PO TB24: 300.0000 mg | ORAL_TABLET | Freq: Every morning | ORAL | 1 refills | Status: AC

## 2024-12-07 MED ORDER — SERTRALINE HCL 100 MG PO TABS: 200.0000 mg | ORAL_TABLET | Freq: Every day | ORAL | 1 refills | Status: AC

## 2024-12-07 MED ORDER — BISOPROLOL FUMARATE 10 MG PO TABS: 10.0000 mg | ORAL_TABLET | Freq: Every day | ORAL | 3 refills | Status: AC

## 2024-12-07 MED ORDER — LEVOTHYROXINE SODIUM 75 MCG PO TABS: 75.0000 ug | ORAL_TABLET | Freq: Every day | ORAL | 3 refills | Status: AC

## 2024-12-07 NOTE — Telephone Encounter (Signed)
 Urgent Otolaryngology referral sent via [facility] to [facility]. Telephone # [phone]. Emailed patient information-Grace Christensen

## 2024-12-07 NOTE — Progress Notes (Signed)
 [facility] [address] [place], [place] [address]  Internal MEDICINE  Office Visit Note  Patient Name: Grace Christensen  [id number]  [id number]  Date of Service: 12/07/2024  Chief Complaint  Patient presents with   Follow-up   Gastroesophageal Reflux   Hypertension   Referral    Urgent ENT   Quality Metric Gaps    Mammogram still needed    HPI Grace Christensen presents for a follow-up visit for chronic sinus issues, hypertension, hypothyroidism, depression, insomnia and screenings.  Chronic sinusitis x3 months -- has been treated for acute sinusitis but her symptoms have not improved. Requesting an ENT referral.  Hypertension -- controlled with bisoprolol  Hypothyroidism -- takes levothyroxine  daily  Depression -- taking sertraline  and bupropion  daily. Insomnia -- taking trazodone  for sleep Over due for routine screening mammogram     Current Medication: Outpatient Encounter Medications as of 12/07/2024  Medication Sig   B Complex Vitamins (B COMPLEX PO) Place 1 mL under the tongue daily.   Calcium Carb-Cholecalciferol 600-800 MG-UNIT TABS Take 1 tablet by mouth daily.   Cholecalciferol (VITAMIN D ) 2000 units tablet Take 2,000 Units by mouth daily.   fexofenadine (ALLEGRA) 180 MG tablet Take 180 mg by mouth every morning. Allergy Relief   Omega-3 Fatty Acids (OMEGA-3 FISH OIL) 1200 MG CAPS Take 1-2 capsules by mouth See admin instructions. 1 capsule in the morning, and 2 capsules in the evening   predniSONE  (STERAPRED UNI-PAK 21 TAB) 10 MG (21) TBPK tablet Use as directed for 6 days   vitamin B-12 (CYANOCOBALAMIN) 1000 MCG tablet Take 1,000 mcg by mouth daily.   [DISCONTINUED] ALPRAZolam  (XANAX ) 0.25 MG tablet Take 1 tablet (0.25 mg total) by mouth 2 (two) times daily as needed for anxiety.   [DISCONTINUED] amoxicillin -clavulanate (AUGMENTIN ) 875-125 MG tablet Take 1 tablet by mouth 2 (two) times daily. Take with food.   [DISCONTINUED] Biotin 1000 MCG tablet Take 1,000 mcg  by mouth daily.   [DISCONTINUED] bisoprolol  (ZEBETA ) 10 MG tablet Take 1 tablet (10 mg total) by mouth at bedtime.   [DISCONTINUED] buPROPion  (WELLBUTRIN  XL) 300 MG 24 hr tablet TAKE 1 TABLET BY MOUTH IN THE  MORNING   [DISCONTINUED] levofloxacin  (LEVAQUIN ) 500 MG tablet Take 1 tablet (500 mg total) by mouth daily. Take with food   [DISCONTINUED] levothyroxine  (SYNTHROID ) 75 MCG tablet TAKE 1 TABLET BY MOUTH DAILY   [DISCONTINUED] Potassium 99 MG TABS Take 1 tablet by mouth daily.   [DISCONTINUED] sertraline  (ZOLOFT ) 100 MG tablet TAKE 2 TABLETS BY MOUTH DAILY   [DISCONTINUED] traZODone  (DESYREL ) 100 MG tablet TAKE 3 TABLETS BY MOUTH AT  BEDTIME AS NEEDED FOR INSOMNNIA   [DISCONTINUED] Zoster Vaccine Adjuvanted (SHINGRIX ) injection Inject 0.5 mLs into the muscle once as needed for up to 1 dose (shingles vaccination).   bisoprolol  (ZEBETA ) 10 MG tablet Take 1 tablet (10 mg total) by mouth at bedtime.   buPROPion  (WELLBUTRIN  XL) 300 MG 24 hr tablet Take 1 tablet (300 mg total) by mouth every morning.   levothyroxine  (SYNTHROID ) 75 MCG tablet Take 1 tablet (75 mcg total) by mouth daily.   sertraline  (ZOLOFT ) 100 MG tablet Take 2 tablets (200 mg total) by mouth daily.   traZODone  (DESYREL ) 100 MG tablet TAKE 3 TABLETS BY MOUTH AT  BEDTIME AS NEEDED FOR INSOMNNIA   No facility-administered encounter medications on file as of 12/07/2024.    Surgical History: Past Surgical History:  Procedure Laterality Date   COLONOSCOPY     FUNCTIONAL ENDOSCOPIC SINUS SURGERY  KNEE ARTHROSCOPY Right 03/04/2017   Procedure: ARTHROSCOPY KNEE, REMOVAL OF LOOSE BODY. CHONDROPLASTY MEDIAL COMPARTMENT;  Surgeon: Lynwood SHAUNNA Hue, MD;  Location: [facility] ORS;  Service: Orthopedics;  Laterality: Right;   NOVASURE ABLATION     TUBAL LIGATION      Medical History: Past Medical History:  Diagnosis Date   Anemia    H/O YEARS AGO   Cancer (HCC)    SKIN CANCER ON FACE   GERD (gastroesophageal reflux disease)     Hypertension    Hypothyroidism    Thyroid  disease    Phreesia 01/21/2021    Family History: Family History  Problem Relation Age of Onset   Cancer Mother    Hypertension Mother    Prostate cancer Father     Social History   Socioeconomic History   Marital status: Married    Spouse name: Not on file   Number of children: Not on file   Years of education: Not on file   Highest education level: Not on file  Occupational History   Occupation: It Trainer    Employer: [employer]  Tobacco Use   Smoking status: Former    Current packs/day: 0.00    Average packs/day: 1 pack/day for 15.0 years (15.0 ttl pk-yrs)    Types: Cigarettes    Start date: 07/27/1994    Quit date: 07/27/2009    Years since quitting: 15.3   Smokeless tobacco: Never  Substance and Sexual Activity   Alcohol use: Yes    Comment: OCC WEEKEND    Drug use: No   Sexual activity: Yes  Other Topics Concern   Not on file  Social History Narrative   Not on file   Social Drivers of Health   Tobacco Use: Medium Risk (12/07/2024)   Patient History    Smoking Tobacco Use: Former    Smokeless Tobacco Use: Never    Passive Exposure: Not on Actuary Strain: Not on file  Food Insecurity: Not on file  Transportation Needs: Not on file  Physical Activity: Not on file  Stress: Not on file  Social Connections: Not on file  Intimate Partner Violence: Not on file  Depression (PHQ2-9): Low Risk (12/07/2024)   Depression (PHQ2-9)    PHQ-2 Score: 0  Alcohol Screen: Low Risk (12/07/2024)   Alcohol Screen    Last Alcohol Screening Score (AUDIT): 2  Housing: Not on file  Utilities: Not on file  Health Literacy: Not on file      Review of Systems  Constitutional:  Positive for fatigue. Negative for chills and fever.  HENT:  Positive for congestion, nosebleeds, postnasal drip, rhinorrhea and sinus pressure. Negative for mouth sores.   Respiratory:  Positive for cough. Negative for chest tightness, shortness  of breath and wheezing.   Cardiovascular: Negative.  Negative for chest pain and palpitations.  Gastrointestinal: Negative.  Negative for diarrhea, nausea and vomiting.  Genitourinary: Negative.  Negative for flank pain.  Neurological:  Positive for headaches.  Psychiatric/Behavioral:  Positive for sleep disturbance.     Vital Signs: BP 110/61   Pulse 68   Temp 98 F (36.7 C)   Resp 16   Ht 5' 4 (1.626 m)   Wt 187 lb 6.4 oz (85 kg)   SpO2 97%   BMI 32.17 kg/m    Physical Exam Vitals and nursing note reviewed.  Constitutional:      General: She is not in acute distress.    Appearance: Normal appearance. She is well-developed. She is  obese. She is ill-appearing. She is not diaphoretic.  HENT:     Head: Normocephalic and atraumatic.     Right Ear: Tympanic membrane, ear canal and external ear normal.     Left Ear: Tympanic membrane, ear canal and external ear normal.     Nose: Congestion and rhinorrhea present.     Mouth/Throat:     Mouth: Mucous membranes are moist.     Pharynx: Posterior oropharyngeal erythema present.  Eyes:     Extraocular Movements: Extraocular movements intact.     Conjunctiva/sclera: Conjunctivae normal.     Pupils: Pupils are equal, round, and reactive to light.  Neck:     Thyroid : No thyromegaly.     Vascular: No JVD.     Trachea: No tracheal deviation.  Cardiovascular:     Rate and Rhythm: Normal rate and regular rhythm.     Pulses: Normal pulses.     Heart sounds: Normal heart sounds. No murmur heard.    No friction rub. No gallop.  Pulmonary:     Effort: Pulmonary effort is normal. No respiratory distress.     Breath sounds: No wheezing.  Lymphadenopathy:     Cervical: Cervical adenopathy present.  Skin:    General: Skin is warm and dry.     Capillary Refill: Capillary refill takes less than 2 seconds.  Neurological:     Mental Status: She is alert and oriented to person, place, and time.  Psychiatric:        Behavior: Behavior  normal.        Thought Content: Thought content normal.        Judgment: Judgment normal.        Assessment/Plan: 1. Chronic sinusitis of both maxillary sinuses (Primary) Urgent referral to ENT and a 6 day prednisone  taper prescribed.  - Ambulatory referral to ENT - predniSONE  (STERAPRED UNI-PAK 21 TAB) 10 MG (21) TBPK tablet; Use as directed for 6 days  Dispense: 21 tablet; Refill: 0  2. Primary hypertension Stable, continue bisoprolol  as prescribed.  - bisoprolol  (ZEBETA ) 10 MG tablet; Take 1 tablet (10 mg total) by mouth at bedtime.  Dispense: 90 tablet; Refill: 3  3. Acquired hypothyroidism Continue levothyroxine  as prescribed.  - levothyroxine  (SYNTHROID ) 75 MCG tablet; Take 1 tablet (75 mcg total) by mouth daily.  Dispense: 90 tablet; Refill: 3  4. Encounter for screening mammogram for malignant neoplasm of breast Routine mammogram ordered  - MM 3D SCREENING MAMMOGRAM BILATERAL BREAST; Future  5. Primary insomnia Continue trazodone  as prescribed  - traZODone  (DESYREL ) 100 MG tablet; TAKE 3 TABLETS BY MOUTH AT  BEDTIME AS NEEDED FOR INSOMNNIA  Dispense: 90 tablet; Refill: 1  6. Moderate episode of recurrent major depressive disorder (HCC) Continue trazodone , sertraline  and bupropion  as prescribed  - traZODone  (DESYREL ) 100 MG tablet; TAKE 3 TABLETS BY MOUTH AT  BEDTIME AS NEEDED FOR INSOMNNIA  Dispense: 90 tablet; Refill: 1 - sertraline  (ZOLOFT ) 100 MG tablet; Take 2 tablets (200 mg total) by mouth daily.  Dispense: 180 tablet; Refill: 1 - buPROPion  (WELLBUTRIN  XL) 300 MG 24 hr tablet; Take 1 tablet (300 mg total) by mouth every morning.  Dispense: 90 tablet; Refill: 1   General Counseling: Lella verbalizes understanding of the findings of todays visit and agrees with plan of treatment. I have discussed any further diagnostic evaluation that may be needed or ordered today. We also reviewed her medications today. she has been encouraged to call the office with any questions  or concerns that should  arise related to todays visit.    Orders Placed This Encounter  Procedures   MM 3D SCREENING MAMMOGRAM BILATERAL BREAST   Ambulatory referral to ENT    Meds ordered this encounter  Medications   traZODone  (DESYREL ) 100 MG tablet    Sig: TAKE 3 TABLETS BY MOUTH AT  BEDTIME AS NEEDED FOR INSOMNNIA    Dispense:  90 tablet    Refill:  1    Please send a replace/new response with 90-Day Supply if appropriate to maximize member benefit. Requesting 1 year supply.   sertraline  (ZOLOFT ) 100 MG tablet    Sig: Take 2 tablets (200 mg total) by mouth daily.    Dispense:  180 tablet    Refill:  1    For future refills please send 90 days.   levothyroxine  (SYNTHROID ) 75 MCG tablet    Sig: Take 1 tablet (75 mcg total) by mouth daily.    Dispense:  90 tablet    Refill:  3    Please send a replace/new response with 90-Day Supply if appropriate to maximize member benefit. Requesting 1 year supply.   buPROPion  (WELLBUTRIN  XL) 300 MG 24 hr tablet    Sig: Take 1 tablet (300 mg total) by mouth every morning.    Dispense:  90 tablet    Refill:  1    For future refills, send 90 days supply.   bisoprolol  (ZEBETA ) 10 MG tablet    Sig: Take 1 tablet (10 mg total) by mouth at bedtime.    Dispense:  90 tablet    Refill:  3    Please send a replace/new response with 90-Day Supply if appropriate to maximize member benefit. Requesting 1 year supply.   predniSONE  (STERAPRED UNI-PAK 21 TAB) 10 MG (21) TBPK tablet    Sig: Use as directed for 6 days    Dispense:  21 tablet    Refill:  0    Return for has CPE on 01/30/25 Sokhna Christoph PCP and  give phone number for [facility], HST was ordered in december by lauren.   Total time spent:30 Minutes Time spent includes review of chart, medications, test results, and follow up plan with the patient.   [place] Controlled Substance Database was reviewed by me.  This patient was seen by Mardy Maxin, FNP-C in collaboration with Dr. Sigrid Bathe as a part of  collaborative care agreement.   Cyanne Delmar R. Maxin, MSN, FNP-C Internal medicine

## 2024-12-14 ENCOUNTER — Telehealth: Payer: Self-pay | Admitting: Nurse Practitioner

## 2024-12-14 NOTE — Telephone Encounter (Signed)
 Otolaryngology appointment 12/14/2024 with  ENT-Toni

## 2024-12-16 ENCOUNTER — Other Ambulatory Visit: Payer: Self-pay | Admitting: Nurse Practitioner

## 2024-12-16 DIAGNOSIS — F5101 Primary insomnia: Secondary | ICD-10-CM

## 2024-12-16 DIAGNOSIS — F331 Major depressive disorder, recurrent, moderate: Secondary | ICD-10-CM

## 2025-01-05 ENCOUNTER — Telehealth: Payer: Self-pay | Admitting: Nurse Practitioner

## 2025-01-05 NOTE — Telephone Encounter (Signed)
 HST was done 12/29/24, equipment returned 01/03/25-Toni

## 2025-01-30 ENCOUNTER — Encounter: Payer: 59 | Admitting: Nurse Practitioner
